# Patient Record
Sex: Male | Born: 1935 | Race: White | Hispanic: No | Marital: Married | Smoking: Never smoker
Health system: Southern US, Community
[De-identification: ages and names within clinical notes are randomized; demographics above are authoritative.]

## PROBLEM LIST (undated history)

## (undated) DIAGNOSIS — Z8546 Personal history of malignant neoplasm of prostate: Secondary | ICD-10-CM

## (undated) DIAGNOSIS — E119 Type 2 diabetes mellitus without complications: Secondary | ICD-10-CM

## (undated) DIAGNOSIS — I10 Essential (primary) hypertension: Secondary | ICD-10-CM

## (undated) DIAGNOSIS — I2510 Atherosclerotic heart disease of native coronary artery without angina pectoris: Secondary | ICD-10-CM

## (undated) DIAGNOSIS — N189 Chronic kidney disease, unspecified: Secondary | ICD-10-CM

## (undated) HISTORY — PX: APPENDECTOMY: SHX54

## (undated) HISTORY — PX: CHOLECYSTECTOMY: SHX55

## (undated) HISTORY — PX: OTHER SURGICAL HISTORY: SHX169

---

## 2014-01-27 ENCOUNTER — Encounter (HOSPITAL_COMMUNITY): Payer: Self-pay | Admitting: Internal Medicine

## 2014-01-27 ENCOUNTER — Inpatient Hospital Stay (HOSPITAL_COMMUNITY)
Admission: AD | Admit: 2014-01-27 | Discharge: 2014-02-01 | DRG: 377 | Disposition: A | Discharge status: Hospice | Payer: Medicare PPO | Source: Other Acute Inpatient Hospital | Attending: Internal Medicine | Admitting: Internal Medicine

## 2014-01-27 DIAGNOSIS — D62 Acute posthemorrhagic anemia: Secondary | ICD-10-CM | POA: Diagnosis present

## 2014-01-27 DIAGNOSIS — K208 Other esophagitis without bleeding: Secondary | ICD-10-CM | POA: Diagnosis present

## 2014-01-27 DIAGNOSIS — Z515 Encounter for palliative care: Secondary | ICD-10-CM

## 2014-01-27 DIAGNOSIS — Z923 Personal history of irradiation: Secondary | ICD-10-CM

## 2014-01-27 DIAGNOSIS — I129 Hypertensive chronic kidney disease with stage 1 through stage 4 chronic kidney disease, or unspecified chronic kidney disease: Secondary | ICD-10-CM | POA: Diagnosis present

## 2014-01-27 DIAGNOSIS — N189 Chronic kidney disease, unspecified: Secondary | ICD-10-CM

## 2014-01-27 DIAGNOSIS — R1013 Epigastric pain: Secondary | ICD-10-CM

## 2014-01-27 DIAGNOSIS — R112 Nausea with vomiting, unspecified: Secondary | ICD-10-CM

## 2014-01-27 DIAGNOSIS — K922 Gastrointestinal hemorrhage, unspecified: Secondary | ICD-10-CM

## 2014-01-27 DIAGNOSIS — E119 Type 2 diabetes mellitus without complications: Secondary | ICD-10-CM

## 2014-01-27 DIAGNOSIS — E860 Dehydration: Secondary | ICD-10-CM | POA: Diagnosis present

## 2014-01-27 DIAGNOSIS — Z79899 Other long term (current) drug therapy: Secondary | ICD-10-CM

## 2014-01-27 DIAGNOSIS — R531 Weakness: Secondary | ICD-10-CM

## 2014-01-27 DIAGNOSIS — I10 Essential (primary) hypertension: Secondary | ICD-10-CM

## 2014-01-27 DIAGNOSIS — I25810 Atherosclerosis of coronary artery bypass graft(s) without angina pectoris: Secondary | ICD-10-CM

## 2014-01-27 DIAGNOSIS — K3189 Other diseases of stomach and duodenum: Secondary | ICD-10-CM

## 2014-01-27 DIAGNOSIS — K311 Adult hypertrophic pyloric stenosis: Secondary | ICD-10-CM

## 2014-01-27 DIAGNOSIS — K319 Disease of stomach and duodenum, unspecified: Secondary | ICD-10-CM | POA: Diagnosis present

## 2014-01-27 DIAGNOSIS — Z8546 Personal history of malignant neoplasm of prostate: Secondary | ICD-10-CM

## 2014-01-27 DIAGNOSIS — R64 Cachexia: Secondary | ICD-10-CM | POA: Diagnosis present

## 2014-01-27 DIAGNOSIS — Z9221 Personal history of antineoplastic chemotherapy: Secondary | ICD-10-CM

## 2014-01-27 DIAGNOSIS — IMO0002 Reserved for concepts with insufficient information to code with codable children: Secondary | ICD-10-CM

## 2014-01-27 DIAGNOSIS — E43 Unspecified severe protein-calorie malnutrition: Secondary | ICD-10-CM

## 2014-01-27 DIAGNOSIS — N179 Acute kidney failure, unspecified: Secondary | ICD-10-CM | POA: Diagnosis present

## 2014-01-27 DIAGNOSIS — F17200 Nicotine dependence, unspecified, uncomplicated: Secondary | ICD-10-CM | POA: Diagnosis present

## 2014-01-27 DIAGNOSIS — K921 Melena: Principal | ICD-10-CM | POA: Diagnosis present

## 2014-01-27 HISTORY — DX: Type 2 diabetes mellitus without complications: E11.9

## 2014-01-27 HISTORY — DX: Personal history of malignant neoplasm of prostate: Z85.46

## 2014-01-27 HISTORY — DX: Chronic kidney disease, unspecified: N18.9

## 2014-01-27 HISTORY — DX: Atherosclerotic heart disease of native coronary artery without angina pectoris: I25.10

## 2014-01-27 HISTORY — DX: Essential (primary) hypertension: I10

## 2014-01-27 LAB — COMPREHENSIVE METABOLIC PANEL
ALK PHOS: 37 U/L — AB (ref 39–117)
ALT: 7 U/L (ref 0–53)
AST: 13 U/L (ref 0–37)
Albumin: 3.1 g/dL — ABNORMAL LOW (ref 3.5–5.2)
BUN: 30 mg/dL — ABNORMAL HIGH (ref 6–23)
CALCIUM: 9.1 mg/dL (ref 8.4–10.5)
CO2: 29 meq/L (ref 19–32)
Chloride: 102 mEq/L (ref 96–112)
Creatinine, Ser: 2.13 mg/dL — ABNORMAL HIGH (ref 0.50–1.35)
GFR calc Af Amer: 31 mL/min — ABNORMAL LOW (ref >90)
GFR, EST NON AFRICAN AMERICAN: 26 mL/min — AB (ref >90)
Glucose, Bld: 140 mg/dL — ABNORMAL HIGH (ref 70–99)
POTASSIUM: 3.9 meq/L (ref 3.7–5.3)
SODIUM: 144 meq/L (ref 137–147)
Total Bilirubin: <0.2 mg/dL — ABNORMAL LOW (ref 0.3–1.2)
Total Protein: 6 g/dL (ref 6.0–8.3)

## 2014-01-27 LAB — CBC WITH DIFFERENTIAL/PLATELET
BASOS ABS: 0 10*3/uL (ref 0.0–0.1)
Basophils Relative: 0 % (ref 0–1)
Eosinophils Absolute: 0 10*3/uL (ref 0.0–0.7)
Eosinophils Relative: 0 % (ref 0–5)
HCT: 23.2 % — ABNORMAL LOW (ref 39.0–52.0)
Hemoglobin: 7.7 g/dL — ABNORMAL LOW (ref 13.0–17.0)
LYMPHS ABS: 0.6 10*3/uL — AB (ref 0.7–4.0)
Lymphocytes Relative: 7 % — ABNORMAL LOW (ref 12–46)
MCH: 30 pg (ref 26.0–34.0)
MCHC: 33.2 g/dL (ref 30.0–36.0)
MCV: 90.3 fL (ref 78.0–100.0)
Monocytes Absolute: 0.9 10*3/uL (ref 0.1–1.0)
Monocytes Relative: 11 % (ref 3–12)
NEUTROS PCT: 81 % — AB (ref 43–77)
Neutro Abs: 6.6 10*3/uL (ref 1.7–7.7)
Platelets: 172 10*3/uL (ref 150–400)
RBC: 2.57 MIL/uL — AB (ref 4.22–5.81)
RDW: 14.5 % (ref 11.5–15.5)
WBC: 8.2 10*3/uL (ref 4.0–10.5)

## 2014-01-27 LAB — PREPARE RBC (CROSSMATCH)

## 2014-01-27 LAB — HEMOGLOBIN AND HEMATOCRIT, BLOOD
HCT: 21.8 % — ABNORMAL LOW (ref 39.0–52.0)
HCT: 25.8 % — ABNORMAL LOW (ref 39.0–52.0)
Hemoglobin: 7.2 g/dL — ABNORMAL LOW (ref 13.0–17.0)
Hemoglobin: 8.7 g/dL — ABNORMAL LOW (ref 13.0–17.0)

## 2014-01-27 LAB — ABO/RH: ABO/RH(D): O POS

## 2014-01-27 MED ORDER — PROMETHAZINE HCL 25 MG/ML IJ SOLN: 12.5000 mg | Freq: Four times a day (QID) | INTRAMUSCULAR | Status: DC | PRN

## 2014-01-27 MED ORDER — ALUM & MAG HYDROXIDE-SIMETH 200-200-20 MG/5ML PO SUSP
30.0000 mL | Freq: Four times a day (QID) | ORAL | Status: DC | PRN
Start: 2014-01-27 — End: 2014-02-01

## 2014-01-27 MED ORDER — ONDANSETRON HCL 4 MG/2ML IJ SOLN
4.0000 mg | Freq: Four times a day (QID) | INTRAMUSCULAR | Status: DC | PRN
  Administered 2014-01-27: 4 mg via INTRAVENOUS
  Filled 2014-01-27: qty 2

## 2014-01-27 MED ORDER — ACETAMINOPHEN 325 MG PO TABS: 650.0000 mg | ORAL_TABLET | Freq: Four times a day (QID) | ORAL | Status: DC | PRN

## 2014-01-27 MED ORDER — SODIUM CHLORIDE 0.9 % IV SOLN: INTRAVENOUS | Status: DC

## 2014-01-27 MED ORDER — SODIUM CHLORIDE 0.9 % IV SOLN
INTRAVENOUS | Status: DC
  Administered 2014-01-27: 05:00:00 via INTRAVENOUS
  Administered 2014-01-28 (×2): 75 mL/h via INTRAVENOUS

## 2014-01-27 MED ORDER — PANTOPRAZOLE SODIUM 40 MG IV SOLR
40.0000 mg | Freq: Two times a day (BID) | INTRAVENOUS | Status: DC
  Administered 2014-01-27 – 2014-01-31 (×10): 40 mg via INTRAVENOUS
  Filled 2014-01-27 (×12): qty 40

## 2014-01-27 MED ORDER — HYDROMORPHONE HCL PF 1 MG/ML IJ SOLN: 0.5000 mg | INTRAMUSCULAR | Status: DC | PRN

## 2014-01-27 MED ORDER — ACETAMINOPHEN 650 MG RE SUPP: 650.0000 mg | Freq: Four times a day (QID) | RECTAL | Status: DC | PRN

## 2014-01-27 MED ORDER — ONDANSETRON HCL 4 MG PO TABS: 4.0000 mg | ORAL_TABLET | Freq: Four times a day (QID) | ORAL | Status: DC | PRN

## 2014-01-27 MED ORDER — OXYCODONE HCL 5 MG PO TABS: 5.0000 mg | ORAL_TABLET | ORAL | Status: DC | PRN

## 2014-01-27 NOTE — Consult Note (Signed)
Subjective:   HPI  The patient is a 80 year old male who was admitted to the hospital because of epigastric abdominal pain, melena, and an abnormal CT scan. The patient states that he has been having pain in the upper abdomen for about a month. His primary care physician put him on ranitidine about a month ago. The pain in the epigastrium has continued. For the last few days he has been experiencing melena. He has recently had some vomiting but denies hematemesis. He went to the hospital in [place] where a CT scan was done and is reported to show evidence of a gastric outlet obstruction. He was transferred here for further evaluation.According to the family his primary care physician was also concern about his color and thought that he was yellow, but his bilirubin here is not elevated. The patient had been on Plavix, but hasn't taken it in a couple of days.  Review of Systems Denies chest pain or shortness of breath.  Past Medical History  Diagnosis Date  . CAD (coronary artery disease)   . Hypertension   . H/O prostate cancer     S/P Chemo and Radiation Rx  . Chronic kidney disease   . Diabetes     taken off meds   Past Surgical History  Procedure Laterality Date  . Excision of skin cancer on scalp     History   Social History  . Marital Status: Married    Spouse Name: N/A    Number of Children: N/A  . Years of Education: N/A   Occupational History  . Not on file.   Social History Main Topics  . Smoking status: Not on file  . Smokeless tobacco: Not on file  . Alcohol Use: Not on file  . Drug Use: Not on file  . Sexual Activity: Not on file   Other Topics Concern  . Not on file   Social History Narrative  . No narrative on file   family history includes Cancer in his father and mother; Diabetes in his brother. Current facility-administered medications:0.9 %  sodium chloride infusion, , Intravenous, Continuous, Theressa Millard, MD, Last Rate: 75 mL/hr at  01/27/14 0443;  acetaminophen (TYLENOL) suppository 650 mg, 650 mg, Rectal, Q6H PRN, Theressa Millard, MD;  acetaminophen (TYLENOL) tablet 650 mg, 650 mg, Oral, Q6H PRN, Theressa Millard, MD alum & mag hydroxide-simeth (MAALOX/MYLANTA) 200-200-20 MG/5ML suspension 30 mL, 30 mL, Oral, Q6H PRN, Theressa Millard, MD;  HYDROmorphone (DILAUDID) injection 0.5-1 mg, 0.5-1 mg, Intravenous, Q3H PRN, Theressa Millard, MD;  ondansetron (ZOFRAN) injection 4 mg, 4 mg, Intravenous, Q6H PRN, Theressa Millard, MD, 4 mg at 01/27/14 0443;  ondansetron (ZOFRAN) tablet 4 mg, 4 mg, Oral, Q6H PRN, Theressa Millard, MD oxyCODONE (Oxy IR/ROXICODONE) immediate release tablet 5 mg, 5 mg, Oral, Q4H PRN, Theressa Millard, MD;  pantoprazole (PROTONIX) injection 40 mg, 40 mg, Intravenous, Q12H, Theressa Millard, MD, 40 mg at 01/27/14 0539;  promethazine (PHENERGAN) injection 12.5 mg, 12.5 mg, Intravenous, Q6H PRN, Robbie Lis, MD Allergies  Allergen Reactions  . Salicylates Anaphylaxis    Allergy to Aspirin      Objective:     BP 140/68  Pulse 74  Temp(Src) 98.1 F (36.7 C) (Oral)  Resp 16  Ht 5\' 9"  (1.753 m)  Wt 60.328 kg (133 lb)  BMI 19.63 kg/m2  SpO2 96%  He does not appear in any acute distress, he is somewhat pale.  Heart regular rhythm no  murmurs  Lungs clear  Abdomen: Bowel sounds normal, soft, mild tenderness in the epigastrium without rebound or guarding, no obvious hepatosplenomegaly    Laboratory No components found with this basename: d1      Assessment:     #1. Melena  #2. Anemia  #3. Report of an abnormal CT scan showing gastric outlet obstruction from an outside hospital.      Plan:     Continue IV fluids. Keep n.p.o. We will proceed with EGD in the morning.     Component Value Date/Time   WBC 8.2 01/27/2014 0508   HGB 7.7* 01/27/2014 0508   HCT 23.2* 01/27/2014 0508   PLT 172 01/27/2014 0508   ALT 7 01/27/2014 0508   AST 13 01/27/2014 0508   NA 144 01/27/2014  0508   K 3.9 01/27/2014 0508   CL 102 01/27/2014 0508   CREATININE 2.13* 01/27/2014 0508   BUN 30* 01/27/2014 0508   CO2 29 01/27/2014 0508   CALCIUM 9.1 01/27/2014 0508   ALKPHOS 37* 01/27/2014 0508

## 2014-01-27 NOTE — Progress Notes (Signed)
Blood transfusion completed.  No active bleeding noted at this time.

## 2014-01-27 NOTE — Progress Notes (Signed)
[facility] PROGRESS NOTE  [name] [name] EQA:[id number] DOB: 21-Jun-1925 DOA: 01/27/2014 PCP: No PCP Per Patient  Brief narrative: Addendum to admission note done today 01/27/2014 80 y.o. male with past medical history of prostate cancer in remission who presented from [facility] ED to [facility] ED 01/27/2014 with ongoing nausea, vomiting and epigastric pain for past 5 days prior to this admission. He also had black tarry stools. CT abd revealed gastric outlet obstruction. CT was done in [place] not in [facility]. Blood work in [facility] revealed hemoglobin of 7.7 and creatinine of 2.13 (no other blood work available for comparison). GI consulted for input on management and further evaluation.  Assessment/Plan:  Principal Problem:   Probable upper GI bleed - possible gastric ulcer, possible malignancy  - Heme (+) stool  - hemoglobin 7.7 on this admission; will give 1 unit PRBC transfusion - continue protonix 40 mg IV Q 12 hours - may continue IV fluids as pt is NPO - appreciate GI consult and recomendations Active Problems:   Gastric outlet obstruction - keep NPO - continue IV fluids - zofran and phenergan for nausea/ vomiting and refractory N/V   Nausea and vomiting - secondary to gastric outlet obstruction - antiemetics PRN   Acute renal failure - possible prerenal etiology secondary to GI losses, dehydration - continue IV fluids - follow up BMP in am  Code Status: full code Family Communication: family at the bedside  Disposition Plan: remains inpatient   Leisa Lenz, MD  [facility] Pager [phone]  If 7PM-7AM, please contact night-coverage [email or web address] Password [facility] 01/27/2014, 9:47 AM   LOS: 0 days   Consultants:  Gastroenterology (Dr. Paulita Fujita)  Procedures:  None   Antibiotics:  None   HPI/Subjective: Still with N/V.  Objective: Filed Vitals:   01/27/14 0300  BP: 140/68  Pulse: 74  Temp: 98.1 F (36.7 C)  TempSrc: Oral  Resp: 16  Height:  5\' 9"  (1.753 m)  Weight: 60.328 kg (133 lb)  SpO2: 96%   No intake or output data in the 24 hours ending 01/27/14 0947  Exam:   General:  Pt is alert, follows commands appropriately, not in acute distress  Cardiovascular: Regular rate and rhythm, S1/S2 appreciated  Respiratory: Clear to auscultation bilaterally, no wheezing, no crackles, no rhonchi  Abdomen: firm to palpation, tender somewhat across mid abdomen, bowel sounds present, no guarding  Extremities: No edema, pulses DP and PT palpable bilaterally  Neuro: Grossly nonfocal  Data Reviewed: Basic Metabolic Panel:  Recent Labs Lab 01/27/14 0508  NA 144  K 3.9  CL 102  CO2 29  GLUCOSE 140*  BUN 30*  CREATININE 2.13*  CALCIUM 9.1   Liver Function Tests:  Recent Labs Lab 01/27/14 0508  AST 13  ALT 7  ALKPHOS 37*  BILITOT <0.2*  PROT 6.0  ALBUMIN 3.1*   No results found for this basename: LIPASE, AMYLASE,  in the last 168 hours No results found for this basename: AMMONIA,  in the last 168 hours CBC:  Recent Labs Lab 01/27/14 0508  WBC 8.2  NEUTROABS 6.6  HGB 7.7*  HCT 23.2*  MCV 90.3  PLT 172   Cardiac Enzymes: No results found for this basename: CKTOTAL, CKMB, CKMBINDEX, TROPONINI,  in the last 168 hours BNP: No components found with this basename: POCBNP,  CBG: No results found for this basename: GLUCAP,  in the last 168 hours  No results found for this or any previous visit (from the past 240 hour(s)).   Studies:  No results found.  Scheduled Meds: . pantoprazole (PROTONIX) IV  40 mg Intravenous Q12H   Continuous Infusions: . sodium chloride 75 mL/hr at 01/27/14 0443

## 2014-01-27 NOTE — H&P (Signed)
[facility] History and Physical  [name] [name] KNL:[id number] DOB: December 29, 1924 DOA: 01/27/2014  Referring physician:  EDP PCP: No PCP Per Patient  Specialists:   Chief Complaint:  Nausea,  Vomiting, ABD Pain and Black Stools  HPI: [name] [name] is a 80 y.o. male who was taken to the [facility] ED due to complaints of nausea and vomiting and Epigastric ABD pain and black tarry stools x 5 days.   He denies having any hematemesis or fevers or chills or chest pain or SOB.   He was seen during the week by his PCP for his symptoms and was found to have jaundice, so the PCP sent blood work and stool studies per the patient.   They were contacted and told to go to the ED  Yesterday.  In the ED in [place] he was evaluated and was found to have a hemoglobin of 7.5 and his last hemoglobin on record  6 months ago had been 9.3.   A rectal exam and FOBT was done and was HEME +.  He was sent for a Ct scan of his ABD without Contrast due to his CKD and the results revealed a Gastric Outlet Obstruction.   Arrangements were made to transfer him to [facility] for a GI consultation and for further evaluation and treatment.       Review of Systems:  Constitutional: No Weight Loss, No Weight Gain, Night Sweats, Fevers, Chills, Fatigue, or +Generalized Weakness HEENT: No Headaches, Difficulty Swallowing,Tooth/Dental Problems,Sore Throat,  No Sneezing, Rhinitis, Ear Ache, Nasal Congestion, or Post Nasal Drip,  Cardio-vascular:  No Chest pain, Orthopnea, PND, Edema in lower extremities, Anasarca, Dizziness, Palpitations  Resp: No Dyspnea, No DOE, No Productive Cough, No Non-Productive Cough, No Hemoptysis, No Change in Color of Mucus,  No Wheezing.    GI: No Heartburn, Indigestion, +Abdominal Pain, +Nausea, +Vomiting,+ Melena,  No Hematemesis, No Hematochezia, No Diarrhea, Change in Bowel Habits,  Loss of Appetite  GU: No Dysuria, Change in Color of Urine, No Urgency or Frequency.  No flank pain.   Musculoskeletal: No Joint Pain or Swelling.  No Decreased Range of Motion. No Back Pain.  Neurologic: No Syncope, No Seizures, Muscle Weakness, Paresthesia, Vision Disturbance or Loss, No Diplopia, No Vertigo, No Difficulty Walking,  Skin: No Rash or Lesions. Psych: No Change in Mood or Affect. No Depression or Anxiety. No Memory loss. No Confusion or Hallucinations   Past Medical History:     CAD  HTN  DM2  Hx  Prostate Cancer S/P Chemo and Radiation Rx  Nephrolithiasis  Past Surgical History:    Skin Cancer Excision from Scalp   Medications:     Tizanidine 2mg  PO TID for Muscle cramps and Spasms  Lisinopril 40 mg PO q day   Plavix 75 mg PO q day  Ranitidine 300 mg PO qhs.        Allergies  Allergen Reactions  . Salicylates Anaphylaxis    Allergy to Aspirin      Social History:   Married   has no tobacco, alcohol, and drug history on file.      Family History:    Mother with Cancer (Unknown type)  Father with Cancer (Unkonwn Type)  Diabetes in 2 Brothers    Physical Exam:  GEN:  Pleasant Elderly Well Nourished and Well Developed  80 y.o. Caucasian male  examined  and in no acute distress; cooperative with exam Filed Vitals:   01/27/14 0300  BP: 140/68  Pulse: 74  Temp: 98.1 F (36.7 C)  TempSrc: Oral  Resp: 16  Height: 5\' 9"  (1.753 m)  Weight: 60.328 kg (133 lb)  SpO2: 96%   Blood pressure 140/68, pulse 74, temperature 98.1 F (36.7 C), temperature source Oral, resp. rate 16, height 5\' 9"  (1.753 m), weight 60.328 kg (133 lb), SpO2 96.00%. PSYCH: He is alert and oriented x4; does not appear anxious does not appear depressed; affect is normal HEENT: Normocephalic and Atraumatic, Mucous membranes pink; PERRLA; EOM intact; Fundi:  Benign;  +Scleral Icterus,  Nares: Patent, Oropharynx: Clear, Edentulous, Neck:  FROM, no cervical lymphadenopathy nor thyromegaly or carotid bruit; no JVD; Breasts:: Not examined CHEST WALL: No tenderness CHEST: Normal  respiration, clear to auscultation bilaterally HEART: Regular rate and rhythm; no murmurs rubs or gallops BACK: No kyphosis or scoliosis; no CVA tenderness ABDOMEN: Positive Bowel Sounds, soft non-tender; no masses, no organomegaly, No Rebound , No Guarding.   Rectal Exam: Done by EDP,   FOBT:   Black soft Stool  HEME+++ EXTREMITIES: No cyanosis, clubbing or edema; no ulcerations. Genitalia: not examined PULSES: 2+ and symmetric SKIN: Normal hydration no rash or ulceration CNS:  Alert and Oriented x 4,  Cranial Nerves Intact except Hard Of Hearing, Sensory and Motor Fxn Intact, Gait not Assessed, Gait: deferred  Vascular: pulses palpable throughout    Labs on Admission:  Basic Metabolic Panel:  Recent Labs Lab 01/27/14 0508  NA 144  K 3.9  CL 102  CO2 29  GLUCOSE 140*  BUN 30*  CREATININE 2.13*  CALCIUM 9.1   Liver Function Tests:  Recent Labs Lab 01/27/14 0508  AST 13  ALT 7  ALKPHOS 37*  BILITOT <0.2*  PROT 6.0  ALBUMIN 3.1*   No results found for this basename: LIPASE, AMYLASE,  in the last 168 hours No results found for this basename: AMMONIA,  in the last 168 hours CBC:  Recent Labs Lab 01/27/14 0508  WBC 8.2  NEUTROABS 6.6  HGB 7.7*  HCT 23.2*  MCV 90.3  PLT 172   Cardiac Enzymes: No results found for this basename: CKTOTAL, CKMB, CKMBINDEX, TROPONINI,  in the last 168 hours  BNP (last 3 results) No results found for this basename: PROBNP,  in the last 8760 hours CBG: No results found for this basename: GLUCAP,  in the last 168 hours  Radiological Exams on Admission: No results found.    Assessment/Plan:   80 y.o. male with  Principal Problem:   GI bleed Active Problems:   Gastric outlet obstruction   Nausea and vomiting   Abdominal pain, epigastric   CAD (coronary artery disease) of artery bypass graft   Diabetes mellitus   Essential hypertension, benign   Chronic kidney disease   H/O prostate cancer     1.  GI Bleed-  Upper Gi  source most likely since he has Melena,  IV Protonix Q 12 hrs, and IVFs ordered for maintenance fluid.  Monitor H/Hs for decrease, and a type and Screen sent .   GI consult in AM.  2.  Gastric Outlet Obstructions- Se[facility] on CT scan done [facility],  ?  Carcinoma  Versus complex Ulcer.   Gi COnsult for Options for evaluation ,  NPO for now, and IV Anti-Emetics PRN, May Need NG Tube placed to LIMS.    3.  Nausea and Vomiting due to #2.    4.  Epigastric ABD Pain- due to #1, and #2.    5.  CAD- Hx in his  remote past,  Has been on Plavix , Plavix discontinued now due to #1.    6.  DM2-  On no medications currently, reports he no longer had to take medication for Diabetes,  Check HbA1c and monitor Glucose levels.    7.  HTN-   Continue Lisinopril Rx. Monitor BPs.    8.  CKD-  Check BUN/Cr, Reports hx of CKD unknown stage, reports  Being seen by a Nephrologist.  Appears Stage IV per labs this AM.    9.  Hx of Prostate Cancer- in remission by reports, hx of Chemo and Radiation Rx.    10 .  SCDs for DVT prophylaxis.        Code Status:   FULL CODE Family Communication:    Wife and Son At Bedside Disposition Plan:       Inpatient  Time spent:  [place] [facility] Pager [phone]  If 7PM-7AM, please contact night-coverage [email or web address] Password [facility] 01/27/2014, 7:33 AM

## 2014-01-28 ENCOUNTER — Encounter (HOSPITAL_COMMUNITY): Payer: Self-pay

## 2014-01-28 ENCOUNTER — Encounter (HOSPITAL_COMMUNITY)
Admission: AD | Disposition: A | Discharge status: Hospice | Payer: Self-pay | Source: Other Acute Inpatient Hospital | Attending: Internal Medicine

## 2014-01-28 DIAGNOSIS — I10 Essential (primary) hypertension: Secondary | ICD-10-CM

## 2014-01-28 HISTORY — PX: ESOPHAGOGASTRODUODENOSCOPY: SHX5428

## 2014-01-28 LAB — URINALYSIS, ROUTINE W REFLEX MICROSCOPIC
Bilirubin Urine: NEGATIVE
GLUCOSE, UA: NEGATIVE mg/dL
Ketones, ur: NEGATIVE mg/dL
Leukocytes, UA: NEGATIVE
Nitrite: NEGATIVE
Protein, ur: NEGATIVE mg/dL
SPECIFIC GRAVITY, URINE: 1.014 (ref 1.005–1.030)
Urobilinogen, UA: 0.2 mg/dL (ref 0.0–1.0)
pH: 5 (ref 5.0–8.0)

## 2014-01-28 LAB — GLUCOSE, CAPILLARY
Glucose-Capillary: 89 mg/dL (ref 70–99)
Glucose-Capillary: 74 mg/dL (ref 70–99)

## 2014-01-28 LAB — HEMOGLOBIN AND HEMATOCRIT, BLOOD
HCT: 23.7 % — ABNORMAL LOW (ref 39.0–52.0)
HCT: 24 % — ABNORMAL LOW (ref 39.0–52.0)
HEMATOCRIT: 28.3 % — AB (ref 39.0–52.0)
HEMOGLOBIN: 8 g/dL — AB (ref 13.0–17.0)
Hemoglobin: 7.9 g/dL — ABNORMAL LOW (ref 13.0–17.0)
Hemoglobin: 9.3 g/dL — ABNORMAL LOW (ref 13.0–17.0)

## 2014-01-28 LAB — BASIC METABOLIC PANEL
BUN: 26 mg/dL — ABNORMAL HIGH (ref 6–23)
CALCIUM: 8.8 mg/dL (ref 8.4–10.5)
CO2: 22 mEq/L (ref 19–32)
Chloride: 106 mEq/L (ref 96–112)
Creatinine, Ser: 2.02 mg/dL — ABNORMAL HIGH (ref 0.50–1.35)
GFR calc Af Amer: 33 mL/min — ABNORMAL LOW (ref >90)
GFR, EST NON AFRICAN AMERICAN: 28 mL/min — AB (ref >90)
GLUCOSE: 77 mg/dL (ref 70–99)
Potassium: 4.4 mEq/L (ref 3.7–5.3)
SODIUM: 141 meq/L (ref 137–147)

## 2014-01-28 LAB — URINE MICROSCOPIC-ADD ON

## 2014-01-28 SURGERY — EGD (ESOPHAGOGASTRODUODENOSCOPY)
Anesthesia: Moderate Sedation

## 2014-01-28 MED ORDER — PRO-STAT SUGAR FREE PO LIQD
30.0000 mL | Freq: Two times a day (BID) | ORAL | Status: DC
  Administered 2014-01-28 – 2014-01-31 (×6): 30 mL via ORAL
  Filled 2014-01-28 (×10): qty 30

## 2014-01-28 MED ORDER — TIZANIDINE HCL 2 MG PO TABS
2.0000 mg | ORAL_TABLET | Freq: Three times a day (TID) | ORAL | Status: DC
  Administered 2014-01-28 – 2014-02-01 (×12): 2 mg via ORAL
  Filled 2014-01-28 (×16): qty 1

## 2014-01-28 MED ORDER — MIDAZOLAM HCL 10 MG/2ML IJ SOLN
INTRAMUSCULAR | Status: DC | PRN
  Administered 2014-01-28 (×2): 1 mg via INTRAVENOUS

## 2014-01-28 MED ORDER — BUTAMBEN-TETRACAINE-BENZOCAINE 2-2-14 % EX AERO
INHALATION_SPRAY | CUTANEOUS | Status: DC | PRN
  Administered 2014-01-28: 2 via TOPICAL

## 2014-01-28 MED ORDER — BOOST / RESOURCE BREEZE PO LIQD
1.0000 | Freq: Two times a day (BID) | ORAL | Status: DC
  Administered 2014-01-29 – 2014-01-31 (×5): 1 via ORAL

## 2014-01-28 MED ORDER — FENTANYL CITRATE 0.05 MG/ML IJ SOLN
INTRAMUSCULAR | Status: AC
  Filled 2014-01-28: qty 2

## 2014-01-28 MED ORDER — MIDAZOLAM HCL 5 MG/ML IJ SOLN
INTRAMUSCULAR | Status: AC
  Filled 2014-01-28: qty 2

## 2014-01-28 MED ORDER — DIPHENHYDRAMINE HCL 50 MG/ML IJ SOLN
INTRAMUSCULAR | Status: AC
  Filled 2014-01-28: qty 1

## 2014-01-28 MED ORDER — FENTANYL CITRATE 0.05 MG/ML IJ SOLN
INTRAMUSCULAR | Status: DC | PRN
  Administered 2014-01-28 (×2): 12.5 ug via INTRAVENOUS

## 2014-01-28 MED ORDER — LISINOPRIL 40 MG PO TABS
40.0000 mg | ORAL_TABLET | Freq: Every day | ORAL | Status: DC
  Administered 2014-01-28: 40 mg via ORAL
  Filled 2014-01-28 (×2): qty 1

## 2014-01-28 NOTE — Op Note (Signed)
[facility] [place], [address]   ENDOSCOPY PROCEDURE REPORT  PATIENT: [name] [name], [name] [name]  MR#: [id number] BIRTHDATE: [birth year] , 6  yrs. old GENDER: Male ENDOSCOPIST: Acquanetta Sit, MD REFERRED BY: PROCEDURE DATE:  01/28/2014 PROCEDURE:   EGD with biopsy ASA CLASS: 2 INDICATIONS: melena, abnormal CT scan raising the possibility of a gastric outlet obstruction MEDICATIONS: fentanyl 25 mcg IV, Versed 2 mg IV TOPICAL ANESTHETIC: Cetacaine spray to the oropharynx  DESCRIPTION OF PROCEDURE:   After the risks benefits and alternatives of the procedure were thoroughly explained, informed consent was obtained.  The Pentax Gastroscope [id number]  endoscope was introduced through the mouth and advanced to the second portion of the duodenum      , limited by Without limitations.   The instrument was slowly withdrawn as the mucosa was fully examined.      FINDINGS:  Esophagus: Distal erosive esophagitis which was friable.  Stomach: Distal antral mass as seen on image 003 and image 007 above. The mass was in the distal antrum right at the pyloric channel area. It was firm to palpation, and friable. Biopsies were obtained of this mass. It partially obstructed the pyloric channel but the scope was able to be maneuvered through the pyloric channel and into the duodenum.  Duodenum: Normal  COMPLICATIONS:none  ENDOSCOPIC IMPRESSION:see above   RECOMMENDATIONS:check pathology on this mass. Probable surgical consultation will be required.      _______________________________ eSigned:  Acquanetta Sit, MD 01/28/2014 9:58 AM

## 2014-01-28 NOTE — Progress Notes (Signed)
UR completed [place] [facility] K. Jaeda Bruso, RN, BSN, MSHL, CCM  01/28/2014 8:22 AM

## 2014-01-28 NOTE — Progress Notes (Addendum)
[facility] PROGRESS NOTE  [name] [name] OZD:[id number] DOB: [birth year] DOA: 01/27/2014 PCP: No PCP Per Patient  Assessment/Plan: 80 y.o. male with past medical history of prostate cancer in remission who presented from [facility] ED to [facility] ED 01/27/2014 with ongoing nausea, vomiting and epigastric pain for past 5 days prior to this admission. He also had black tarry stools. CT abd revealed gastric outlet obstruction. CT was done in [place] not in [facility]. Blood work in [facility] revealed hemoglobin of 7.7 and creatinine of 2.13 (no other blood work available for comparison). GI consulted for input on management and further evaluation.    Principal Problem:  1. Probable upper GI bleed  - possible gastric ulcer, possible malignancy with Heme (+) stool  - continue protonix 40 mg IV Q 12 hours, pend EGD;  appreciate GI consult and recomendations   2. Gastric outlet obstruction  - keep NPO, continue IV fluids, zofran and phenergan for nausea/ vomiting and refractory N/V  -pen EGD  3. Acute blood loss anemia due to GIB;  -TFsed 1 unit 2/15; monitor Hg, TF prn  4. Nausea and vomiting  - secondary to gastric outlet obstruction,. antiemetics PRN   5. Acute renal failure ? Prerenal, + ACE  - continue IV fluids, follow up BMP in am; hold ACE, obtain UA  6. HTN hold ACE, cont monitor off meds, stable   Code Status: full Family Communication: d/w patient, wife, step son (indicate person spoken with, relationship, and if by phone, the number) Disposition Plan: home when ready, pend EGD   Consultants:  GI  Procedures:  Pend EGD   Antibiotics:  None  (indicate start date, and stop date if known)  HPI/Subjective: alert  Objective: Filed Vitals:   01/28/14 0459  BP: 114/48  Pulse: 85  Temp: 98.9 F (37.2 C)  Resp: 17    Intake/Output Summary (Last 24 hours) at 01/28/14 0805 Last data filed at 01/27/14 1503  Gross per 24 hour  Intake 641.87 ml  Output    200 ml   Net 441.87 ml   Filed Weights   01/27/14 0300 01/28/14 0459  Weight: 60.328 kg (133 lb) 61.3 kg (135 lb 2.3 oz)    Exam:   General:  alert  Cardiovascular: s1,s2 rrr  Respiratory: few crackles in LL  Abdomen: soft, mild tender epigastric, ND  Musculoskeletal: no LE edema    Data Reviewed: Basic Metabolic Panel:  Recent Labs Lab 01/27/14 0508  NA 144  K 3.9  CL 102  CO2 29  GLUCOSE 140*  BUN 30*  CREATININE 2.13*  CALCIUM 9.1   Liver Function Tests:  Recent Labs Lab 01/27/14 0508  AST 13  ALT 7  ALKPHOS 37*  BILITOT <0.2*  PROT 6.0  ALBUMIN 3.1*   No results found for this basename: LIPASE, AMYLASE,  in the last 168 hours No results found for this basename: AMMONIA,  in the last 168 hours CBC:  Recent Labs Lab 01/27/14 0508 01/27/14 1145 01/27/14 2000 01/28/14 0447  WBC 8.2  --   --   --   NEUTROABS 6.6  --   --   --   HGB 7.7* 7.2* 8.7* 7.9*  HCT 23.2* 21.8* 25.8* 23.7*  MCV 90.3  --   --   --   PLT 172  --   --   --    Cardiac Enzymes: No results found for this basename: CKTOTAL, CKMB, CKMBINDEX, TROPONINI,  in the last 168 hours BNP (last 3 results)  No results found for this basename: PROBNP,  in the last 8760 hours CBG: No results found for this basename: GLUCAP,  in the last 168 hours  No results found for this or any previous visit (from the past 240 hour(s)).   Studies: No results found.  Scheduled Meds: . pantoprazole (PROTONIX) IV  40 mg Intravenous Q12H   Continuous Infusions: . sodium chloride 75 mL/hr (01/28/14 0747)  . sodium chloride      Principal Problem:   GI bleed Active Problems:   Gastric outlet obstruction   Nausea and vomiting   Abdominal pain, epigastric   CAD (coronary artery disease) of artery bypass graft   Diabetes mellitus   Essential hypertension, benign   Chronic kidney disease   H/O prostate cancer    Time spent: >35 minutes     [name] [name]  [facility] Pager [phone].  If 7PM-7AM, please contact night-coverage at [email or web address], password [facility] 01/28/2014, 8:05 AM  LOS: 1 day

## 2014-01-28 NOTE — Progress Notes (Signed)
INITIAL NUTRITION ASSESSMENT  DOCUMENTATION CODES Per approved criteria  -Severe malnutrition in the context of chronic illness  Pt meets criteria for SEVERE MALNUTRITION in the context of Chronic Illness as evidenced by >22% weight loss in less than 6 months and severe muscle wasting in physical exam.  INTERVENTION: Provide Resource Breeze BID until diet advanced Provide Pro-stat BID until diet advanced Diet advancement per MD discretion If pt is unable to tolerate PO's with diet advancement, pt may need a J-tube for nutrition support RD to continue to monitor nutrition care plan  NUTRITION DIAGNOSIS: Unintentional weight loss related to medical condition as evidenced by >22% weight loss in less than 6 months.   Goal: Pt to meet >/= 90% of their estimated nutrition needs   Monitor:  Diet advancement/PO intake, weight, labs  Reason for Assessment: Malnutrition Screening Tool, score of 4  80 y.o. male  Admitting Dx: GI bleed  ASSESSMENT: 80 y.o. male who was taken to the [facility] ED due to complaints of nausea and vomiting and Epigastric ABD pain and black tarry stools x 5 days. He denies having any hematemesis or fevers or chills or chest pain or SOB. He was seen during the week by his PCP for his symptoms and was found to have jaundice, so the PCP sent blood work and stool studies per the patient. They were contacted and told to go to the ED Yesterday. In the ED in [place] he was evaluated and was found to have a hemoglobin of 7.5 and his last hemoglobin on record 6 months ago had been 9.3. A rectal exam and FOBT was done and was HEME +. He was sent for a Ct scan of his ABD without Contrast due to his CKD and the results revealed a Gastric Outlet Obstruction. Arrangements were made to transfer him to [facility] for a GI consultation and for further evaluation and treatment.   Pt states that his usual body weight is 174 lbs. Pt's son states that pt weight 189 lbs 5  months ago at an MD appointment. Pt reports having a good appetite and eating well PTA; pt is unsure why he is losing weight. Per pt's son, pt has been losing weight due to worrying and stress. Pt was NPO this morning, diet advanced to clear liquids for lunch and pt consumed 80% of tray items. Pt denies any chewing or swallowing difficulty. Per RN, pt needs his meds crushed.  Nutrition Focused Physical Exam:  Subcutaneous Fat:  Orbital Region: moderate wasting Upper Arm Region: moderate wasting Thoracic and Lumbar Region: NA  Muscle:  Temple Region: mild wasting Clavicle Bone Region: moderate wasting Clavicle and Acromion Bone Region: mild wasting Scapular Bone Region: NA Dorsal Hand: moderate wasting Patellar Region: severe wasting Anterior Thigh Region: moderate wasting Posterior Calf Region: mild wasting  Edema: none   Height: Ht Readings from Last 1 Encounters:  01/27/14 5\' 9"  (1.753 m)    Weight: Wt Readings from Last 1 Encounters:  01/28/14 135 lb 2.3 oz (61.3 kg)    Ideal Body Weight: 160 lbs  % Ideal Body Weight: 84%  Wt Readings from Last 10 Encounters:  01/28/14 135 lb 2.3 oz (61.3 kg)  01/28/14 135 lb 2.3 oz (61.3 kg)    Usual Body Weight: 174 lbs  % Usual Body Weight: 77.5%  BMI:  Body mass index is 19.95 kg/(m^2).  Estimated Nutritional Needs: Kcal: 1900-2100 Protein: 70-80 grams Fluid: 1.9-2.1 L/day  Skin: intact  Diet Order: Clear Liquid  EDUCATION NEEDS: -No education needs identified at this time   Intake/Output Summary (Last 24 hours) at 01/28/14 1525 Last data filed at 01/28/14 1250  Gross per 24 hour  Intake    240 ml  Output      0 ml  Net    240 ml    Last BM: 2/13   Labs:   Recent Labs Lab 01/27/14 0508 01/28/14 1240  NA 144 141  K 3.9 4.4  CL 102 106  CO2 29 22  BUN 30* 26*  CREATININE 2.13* 2.02*  CALCIUM 9.1 8.8  GLUCOSE 140* 77    CBG (last 3)   Recent Labs  01/28/14 0846 01/28/14 1110  GLUCAP 89  74    Scheduled Meds: . lisinopril  40 mg Oral Daily  . pantoprazole (PROTONIX) IV  40 mg Intravenous Q12H  . tiZANidine  2 mg Oral Q8H    Continuous Infusions: . sodium chloride 75 mL/hr (01/28/14 0747)    Past Medical History  Diagnosis Date  . CAD (coronary artery disease)   . Hypertension   . H/O prostate cancer     S/P Chemo and Radiation Rx  . Chronic kidney disease   . Diabetes     taken off meds    Past Surgical History  Procedure Laterality Date  . Excision of skin cancer on scalp      Pryor Ochoa RD, LDN Inpatient Clinical Dietitian Pager: [phone] After Hours Pager: [phone]

## 2014-01-29 ENCOUNTER — Inpatient Hospital Stay (HOSPITAL_COMMUNITY): Payer: Medicare PPO

## 2014-01-29 ENCOUNTER — Encounter (HOSPITAL_COMMUNITY): Payer: Self-pay | Admitting: Gastroenterology

## 2014-01-29 DIAGNOSIS — K922 Gastrointestinal hemorrhage, unspecified: Secondary | ICD-10-CM

## 2014-01-29 DIAGNOSIS — D490 Neoplasm of unspecified behavior of digestive system: Secondary | ICD-10-CM

## 2014-01-29 LAB — BASIC METABOLIC PANEL
BUN: 28 mg/dL — ABNORMAL HIGH (ref 6–23)
CO2: 22 meq/L (ref 19–32)
Calcium: 8.4 mg/dL (ref 8.4–10.5)
Chloride: 103 mEq/L (ref 96–112)
Creatinine, Ser: 2.01 mg/dL — ABNORMAL HIGH (ref 0.50–1.35)
GFR calc Af Amer: 33 mL/min — ABNORMAL LOW (ref >90)
GFR, EST NON AFRICAN AMERICAN: 28 mL/min — AB (ref >90)
GLUCOSE: 92 mg/dL (ref 70–99)
POTASSIUM: 4.2 meq/L (ref 3.7–5.3)
Sodium: 137 mEq/L (ref 137–147)

## 2014-01-29 LAB — CBC
HEMATOCRIT: 23.3 % — AB (ref 39.0–52.0)
HEMOGLOBIN: 7.7 g/dL — AB (ref 13.0–17.0)
MCH: 30.2 pg (ref 26.0–34.0)
MCHC: 33 g/dL (ref 30.0–36.0)
MCV: 91.4 fL (ref 78.0–100.0)
Platelets: 148 10*3/uL — ABNORMAL LOW (ref 150–400)
RBC: 2.55 MIL/uL — AB (ref 4.22–5.81)
RDW: 14.1 % (ref 11.5–15.5)
WBC: 7.1 10*3/uL (ref 4.0–10.5)

## 2014-01-29 LAB — PREPARE RBC (CROSSMATCH)

## 2014-01-29 LAB — GLUCOSE, CAPILLARY: Glucose-Capillary: 93 mg/dL (ref 70–99)

## 2014-01-29 MED ORDER — DEXTROSE-NACL 5-0.9 % IV SOLN
INTRAVENOUS | Status: DC
  Administered 2014-01-29: 18:00:00 via INTRAVENOUS
  Administered 2014-01-30: 20 mL/h via INTRAVENOUS

## 2014-01-29 NOTE — Progress Notes (Addendum)
[facility] PROGRESS NOTE  [name] [name] JXB:[id number] DOB: [birth year] DOA: 01/27/2014 PCP: No PCP Per Patient  Assessment/Plan: 80 y.o. male with past medical history of prostate cancer in remission who presented from [facility] ED to [facility] ED 01/27/2014 with ongoing nausea, vomiting and epigastric pain for past 5 days prior to this admission. He also had black tarry stools. CT abd revealed gastric outlet obstruction. CT was done in [place] not in [facility]. Blood work in [facility] revealed hemoglobin of 7.7 and creatinine of 2.13 (no other blood work available for comparison). GI consulted for input on management and further evaluation.    Principal Problem:  1. Upper GI bleed with gastric mass  - s/p EGD: Distal erosive esophagitis which was friable, Distal antral mass; pend biopsy   -patient initially refusing surgical evaluation or operation; but now agreed; consulted surgery  -continue protonix 40 mg IV Q 12 hours, appreciate GI consult and recomendations   2. Gastric outlet obstruction  - diet clears, continue IV fluids, zofran and phenergan for nausea/ vomiting and refractory N/V   3. Acute blood loss anemia due to GIB;  -TFsed 1 unit 2/15; Tf sing 1 unit 2/17;  TF prn  4. Nausea and vomiting  - secondary to gastric outlet obstruction,. antiemetics PRN   5. Acute renal failure ? Prerenal, + ACE  - continue IV fluids, follow up BMP in am; hold ACE, obtain UA; renal US  6. HTN hold ACE, cont monitor off meds, stable   Code Status: full Family Communication: d/w patient, wife, step son (indicate person spoken with, relationship, and if by phone, the number) Disposition Plan: home when ready   Consultants:  GI  Procedures:  Pend EGD   Antibiotics:  None  (indicate start date, and stop date if known)  HPI/Subjective: alert  Objective: Filed Vitals:   01/29/14 0639  BP: 117/49  Pulse: 74  Temp: 98.1 F (36.7 C)  Resp: 18    Intake/Output Summary (Last  24 hours) at 01/29/14 0849 Last data filed at 01/28/14 1859  Gross per 24 hour  Intake   1185 ml  Output      0 ml  Net   1185 ml   Filed Weights   01/27/14 0300 01/28/14 0459 01/29/14 0639  Weight: 60.328 kg (133 lb) 61.3 kg (135 lb 2.3 oz) 62 kg (136 lb 11 oz)    Exam:   General:  alert  Cardiovascular: s1,s2 rrr  Respiratory: few crackles in LL  Abdomen: soft, mild tender epigastric, ND  Musculoskeletal: no LE edema    Data Reviewed: Basic Metabolic Panel:  Recent Labs Lab 01/27/14 0508 01/28/14 1240 01/29/14 0415  NA 144 141 137  K 3.9 4.4 4.2  CL 102 106 103  CO2 29 22 22   GLUCOSE 140* 77 92  BUN 30* 26* 28*  CREATININE 2.13* 2.02* 2.01*  CALCIUM 9.1 8.8 8.4   Liver Function Tests:  Recent Labs Lab 01/27/14 0508  AST 13  ALT 7  ALKPHOS 37*  BILITOT <0.2*  PROT 6.0  ALBUMIN 3.1*   No results found for this basename: LIPASE, AMYLASE,  in the last 168 hours No results found for this basename: AMMONIA,  in the last 168 hours CBC:  Recent Labs Lab 01/27/14 0508  01/27/14 2000 01/28/14 0447 01/28/14 1240 01/28/14 1902 01/29/14 0415  WBC 8.2  --   --   --   --   --  7.1  NEUTROABS 6.6  --   --   --   --   --   --  HGB 7.7*  < > 8.7* 7.9* 9.3* 8.0* 7.7*  HCT 23.2*  < > 25.8* 23.7* 28.3* 24.0* 23.3*  MCV 90.3  --   --   --   --   --  91.4  PLT 172  --   --   --   --   --  148*  < > = values in this interval not displayed. Cardiac Enzymes: No results found for this basename: CKTOTAL, CKMB, CKMBINDEX, TROPONINI,  in the last 168 hours BNP (last 3 results) No results found for this basename: PROBNP,  in the last 8760 hours CBG:  Recent Labs Lab 01/28/14 0846 01/28/14 1110 01/29/14 0648  GLUCAP 89 74 93    No results found for this or any previous visit (from the past 240 hour(s)).   Studies: No results found.  Scheduled Meds: . feeding supplement (PRO-STAT SUGAR FREE 64)  30 mL Oral BID PC  . feeding supplement (RESOURCE BREEZE)   1 Container Oral BID WC  . lisinopril  40 mg Oral Daily  . pantoprazole (PROTONIX) IV  40 mg Intravenous Q12H  . tiZANidine  2 mg Oral Q8H   Continuous Infusions: . sodium chloride 75 mL/hr (01/28/14 2252)    Principal Problem:   GI bleed Active Problems:   Gastric outlet obstruction   Nausea and vomiting   Abdominal pain, epigastric   CAD (coronary artery disease) of artery bypass graft   Diabetes mellitus   Essential hypertension, benign   Chronic kidney disease   H/O prostate cancer    Time spent: >35 minutes     Kinnie Feil  [facility] Pager [phone]. If 7PM-7AM, please contact night-coverage at [email or web address], password [facility] 01/29/2014, 8:49 AM  LOS: 2 days

## 2014-01-29 NOTE — Consult Note (Signed)
Reason for Consult: distal antral mass, upper GI bleeding Referring Physician: Dr. Rowe Clack   HPI: [name] [name] is a 80 year old male with a history of open cholecystectomy, CAD/MI, HTN, prostate cancer, diabetes mellitus and CKD who initially presented to [facility] with abdominal.  Duration of symptoms is 1 month.  Onset was gradual.  Coarse is worsening.  Moderate in severity.  Associated with 50lbs weight loss over the past 5 months and constipation.  He denies melena or hematochezia.  He has been chewing tobacco for 62 years.  Denies alcohol use.  He characterizes his symptoms as burning pressure pain.  Location in the epigastric region without radiation.  He reports 1 episode of emesis on Saturday.  He denies anorexia or change in stool caliber. Modifying factors; zantac which was prescribed by primary care without any relief.  He takes plavix, reports last dose being on Saturday the 15th.  At [facility] he was found to have a hgb of 7.5 and positive guaiac.  A CT of abdomen and pelvis showed a gastric outlet obstruction.  He has transferred to [facility] for further evaluation.  He underwent a UGI with Dr. Penelope Coop which revealed a partially obstructing distal antral mass.  Therefore we have been consulted.  At present time, he is tolerating a clear liquid diet.  He has not had a bowel movement since Saturday.  H&H this AM 7.7/23.3.  He is currently receiving a unit of PRBCs.  He is unsure whether he wants to proceed with a surgery and wishes to take a day or two to decide with his wife and six children.    Past Medical History  Diagnosis Date  . CAD (coronary artery disease)   . Hypertension   . H/O prostate cancer     S/P Chemo and Radiation Rx  . Chronic kidney disease   . Diabetes     taken off meds    Past Surgical History  Procedure Laterality Date  . Excision of skin cancer on scalp    . Esophagogastroduodenoscopy N/A 01/28/2014    Procedure: ESOPHAGOGASTRODUODENOSCOPY (EGD);   Surgeon: Wonda Horner, MD;  Location: [facility] ENDOSCOPY;  Service: Endoscopy;  Laterality: N/A;  . Cholecystectomy    . Appendectomy      Family History  Problem Relation Age of Onset  . Cancer Father     Unknown type  . Cancer Mother     Unknown Type  . Diabetes Brother      X 2     Social History:  reports that he has never smoked. His smokeless tobacco use includes Chew. He reports that he does not drink alcohol or use illicit drugs.  Allergies:  Allergies  Allergen Reactions  . Salicylates Anaphylaxis    Allergy to Aspirin     Medications:  Scheduled Meds: . feeding supplement (PRO-STAT SUGAR FREE 64)  30 mL Oral BID PC  . feeding supplement (RESOURCE BREEZE)  1 Container Oral BID WC  . pantoprazole (PROTONIX) IV  40 mg Intravenous Q12H  . tiZANidine  2 mg Oral Q8H   Continuous Infusions: . dextrose 5 % and 0.9% NaCl     PRN Meds:.acetaminophen, acetaminophen, alum & mag hydroxide-simeth, HYDROmorphone (DILAUDID) injection, ondansetron (ZOFRAN) IV, ondansetron, oxyCODONE, promethazine  Results for orders placed during the hospital encounter of 01/27/14 (from the past 48 hour(s))  HEMOGLOBIN AND HEMATOCRIT, BLOOD     Status: Abnormal   Collection Time    01/27/14  8:00 PM  Result Value Ref Range   Hemoglobin 8.7 (*) 13.0 - 17.0 g/dL   Comment: DELTA CHECK NOTED     REPEATED TO VERIFY   HCT 25.8 (*) 39.0 - 52.0 %  HEMOGLOBIN AND HEMATOCRIT, BLOOD     Status: Abnormal   Collection Time    01/28/14  4:47 AM      Result Value Ref Range   Hemoglobin 7.9 (*) 13.0 - 17.0 g/dL   HCT 76.4 (*) 90.4 - 25.0 %  GLUCOSE, CAPILLARY     Status: None   Collection Time    01/28/14  8:46 AM      Result Value Ref Range   Glucose-Capillary 89  70 - 99 mg/dL  GLUCOSE, CAPILLARY     Status: None   Collection Time    01/28/14 11:10 AM      Result Value Ref Range   Glucose-Capillary 74  70 - 99 mg/dL   Comment 1 Notify RN    HEMOGLOBIN AND HEMATOCRIT, BLOOD     Status:  Abnormal   Collection Time    01/28/14 12:40 PM      Result Value Ref Range   Hemoglobin 9.3 (*) 13.0 - 17.0 g/dL   HCT 53.9 (*) 49.8 - 99.1 %  BASIC METABOLIC PANEL     Status: Abnormal   Collection Time    01/28/14 12:40 PM      Result Value Ref Range   Sodium 141  137 - 147 mEq/L   Potassium 4.4  3.7 - 5.3 mEq/L   Chloride 106  96 - 112 mEq/L   CO2 22  19 - 32 mEq/L   Glucose, Bld 77  70 - 99 mg/dL   BUN 26 (*) 6 - 23 mg/dL   Creatinine, Ser 0.68 (*) 0.50 - 1.35 mg/dL   Calcium 8.8  8.4 - 59.1 mg/dL   GFR calc non Af Amer 28 (*) >90 mL/min   GFR calc Af Amer 33 (*) >90 mL/min   Comment: (NOTE)     The eGFR has been calculated using the CKD EPI equation.     This calculation has not been validated in all clinical situations.     eGFR's persistently <90 mL/min signify possible Chronic Kidney     Disease.  HEMOGLOBIN AND HEMATOCRIT, BLOOD     Status: Abnormal   Collection Time    01/28/14  7:02 PM      Result Value Ref Range   Hemoglobin 8.0 (*) 13.0 - 17.0 g/dL   HCT 72.1 (*) 46.1 - 96.8 %  URINALYSIS, ROUTINE W REFLEX MICROSCOPIC     Status: Abnormal   Collection Time    01/28/14  8:42 PM      Result Value Ref Range   Color, Urine YELLOW  YELLOW   APPearance CLOUDY (*) CLEAR   Specific Gravity, Urine 1.014  1.005 - 1.030   pH 5.0  5.0 - 8.0   Glucose, UA NEGATIVE  NEGATIVE mg/dL   Hgb urine dipstick SMALL (*) NEGATIVE   Bilirubin Urine NEGATIVE  NEGATIVE   Ketones, ur NEGATIVE  NEGATIVE mg/dL   Protein, ur NEGATIVE  NEGATIVE mg/dL   Urobilinogen, UA 0.2  0.0 - 1.0 mg/dL   Nitrite NEGATIVE  NEGATIVE   Leukocytes, UA NEGATIVE  NEGATIVE  URINE MICROSCOPIC-ADD ON     Status: None   Collection Time    01/28/14  8:42 PM      Result Value Ref Range   Squamous Epithelial / LPF RARE  RARE   WBC, UA 0-2  <3 WBC/hpf   RBC / HPF 3-6  <3 RBC/hpf   Bacteria, UA RARE  RARE  BASIC METABOLIC PANEL     Status: Abnormal   Collection Time    01/29/14  4:15 AM      Result Value  Ref Range   Sodium 137  137 - 147 mEq/L   Potassium 4.2  3.7 - 5.3 mEq/L   Chloride 103  96 - 112 mEq/L   CO2 22  19 - 32 mEq/L   Glucose, Bld 92  70 - 99 mg/dL   BUN 28 (*) 6 - 23 mg/dL   Creatinine, Ser 8.71 (*) 0.50 - 1.35 mg/dL   Calcium 8.4  8.4 - 95.9 mg/dL   GFR calc non Af Amer 28 (*) >90 mL/min   GFR calc Af Amer 33 (*) >90 mL/min   Comment: (NOTE)     The eGFR has been calculated using the CKD EPI equation.     This calculation has not been validated in all clinical situations.     eGFR's persistently <90 mL/min signify possible Chronic Kidney     Disease.  CBC     Status: Abnormal   Collection Time    01/29/14  4:15 AM      Result Value Ref Range   WBC 7.1  4.0 - 10.5 K/uL   RBC 2.55 (*) 4.22 - 5.81 MIL/uL   Hemoglobin 7.7 (*) 13.0 - 17.0 g/dL   HCT 74.7 (*) 18.5 - 50.1 %   MCV 91.4  78.0 - 100.0 fL   MCH 30.2  26.0 - 34.0 pg   MCHC 33.0  30.0 - 36.0 g/dL   RDW 58.6  82.5 - 74.9 %   Platelets 148 (*) 150 - 400 K/uL  GLUCOSE, CAPILLARY     Status: None   Collection Time    01/29/14  6:48 AM      Result Value Ref Range   Glucose-Capillary 93  70 - 99 mg/dL   Comment 1 Notify RN    PREPARE RBC (CROSSMATCH)     Status: None   Collection Time    01/29/14  8:54 AM      Result Value Ref Range   Order Confirmation ORDER PROCESSED BY BLOOD BANK      US Renal  01/29/2014   CLINICAL DATA:  Acute renal injury.  EXAM: RENAL/URINARY TRACT ULTRASOUND COMPLETE  COMPARISON:  None.  FINDINGS: Right Kidney:  Length: 9.7 cm. Renal cortical thinning and prominent renal sinus fat. The renal cortex is also slightly echogenic. No focal lesions or hydronephrosis.  Left Kidney:  Length: 9.6 cm. Renal cortical thinning and increased echogenicity but no hydronephrosis or renal mass.  Bladder:  Normal.  IMPRESSION: Age related renal cortical thinning.  No mass or hydronephrosis.   Electronically Signed   By: Loralie Champagne M.D.   On: 01/29/2014 11:07    Review of Systems   Constitutional: Positive for weight loss. Negative for fever, chills, malaise/fatigue and diaphoresis.  HENT: Negative.   Eyes: Negative.   Respiratory: Negative.   Cardiovascular: Negative.   Gastrointestinal: Positive for abdominal pain and constipation. Negative for nausea, vomiting, diarrhea, blood in stool and melena.  Genitourinary: Negative.   Musculoskeletal: Negative.   Skin: Negative.   Neurological: Negative.  Negative for weakness.  Endo/Heme/Allergies: Negative.   Psychiatric/Behavioral: Negative.    Blood pressure 146/64, pulse 66, temperature 97.8 F (36.6 C), temperature source Oral, resp. rate 18, height  $'5\' 9"'Y$  (1.753 m), weight 136 lb 11 oz (62 kg), SpO2 98.00%. Physical Exam  Constitutional: He is oriented to person, place, and time. He appears well-developed and well-nourished. No distress.  Cardiovascular: Normal rate, regular rhythm, normal heart sounds and intact distal pulses.  Exam reveals no gallop and no friction rub.   No murmur heard. Respiratory: Effort normal and breath sounds normal. No respiratory distress. He has no wheezes. He has no rales. He exhibits no tenderness.  GI: Soft. Bowel sounds are normal. He exhibits no distension and no mass. There is no tenderness. There is no rebound and no guarding.  RUQ GB scar  Musculoskeletal: He exhibits no edema and no tenderness.  Neurological: He is alert and oriented to person, place, and time.  Skin: Skin is warm and dry. No rash noted. He is not diaphoretic. No erythema. No pallor.  Psychiatric: He has a normal mood and affect. His behavior is normal. Judgment and thought content normal.    Assessment/Plan: HTN DM Type II CKD Hx of open cholecystectomy CAD/MI(plavix last dose 2/15) Partially obstructing distal antral mass The patient is unsure whether he wishes to proceed with surgical intervention.  He would require a Billroth II operation.  I discussed his options in length, however, I am still  concerned that he does not fully comprehend the risks of surgery versus non operative or palliative care measures.  He is agreeable to a meeting the palliative care team.  i have left a message with the [facility] team.  He does not appear to be actively bleeding at this time. He is hemodynamically stable and off Pavix for 2 days now.  Agree with blood transfusion, monitoring hemoglobin and hematocrit, IV hydration, would not advance diet.  He may also need a cardiac clearance.  Thank you for the consult.  Surgery will follow.    Duel Conrad ANP-BC 01/29/2014, 1:43 PM

## 2014-01-29 NOTE — Consult Note (Signed)
I saw the patient, participated in the history, exam and medical decision making, and concur with the physician assistant's note above.  Add anemia and protein calorie malnutrition to assessment  Lots of family members at bedside when I visited pt.  Alert, nad, cachetic Soft, nt, nd. Well healed subcostal incision.   Advised pt and family we needed to wait for bx results to come back before I make my final recommendation b/c it may change operative approach and what is required.   Family very confused about plan and thought pt had to make a decision about surgery by tomorrow and were upset that he would not be able to get any additional blood if needed.   Had to do a lot of pt and family recovery.   Explained that we are still gathering info to make a informed recommendation. Explained that what other physician probably meant was trying to determine if pt was even interested in surgery. Moreover, my guess is the "no additional blood" comment was meant that if pt declines surgery (even if we offer it) additional blood transfusions would be futile if the offending lesion is not removed and I explained this to family. Showed endoscopy images to pt and family and discussed stomach anatomy and how mass is causing a problem  I need to review Gerty CT with radiology Rec palliative care consult to discuss what pt's long term goals are and to get onboard.  At this point we need to wait on path results in order to get true idea of risk/benefits/complications of surgery and then we can have family meeting to determine best course of action given pt's age, functional status and comorbidities.   In interim - clears and nutritional shakes such as resource, etc Will follow PT consult   Leighton Ruff. Redmond Pulling, MD, FACS General, Bariatric, & Minimally Invasive Surgery [facility], [place]

## 2014-01-30 DIAGNOSIS — E43 Unspecified severe protein-calorie malnutrition: Secondary | ICD-10-CM | POA: Diagnosis present

## 2014-01-30 DIAGNOSIS — K3189 Other diseases of stomach and duodenum: Secondary | ICD-10-CM

## 2014-01-30 DIAGNOSIS — K319 Disease of stomach and duodenum, unspecified: Secondary | ICD-10-CM

## 2014-01-30 DIAGNOSIS — E46 Unspecified protein-calorie malnutrition: Secondary | ICD-10-CM

## 2014-01-30 LAB — TYPE AND SCREEN
ABO/RH(D): O POS
ANTIBODY SCREEN: NEGATIVE
UNIT DIVISION: 0
Unit division: 0

## 2014-01-30 LAB — CBC
HCT: 25.9 % — ABNORMAL LOW (ref 39.0–52.0)
Hemoglobin: 8.8 g/dL — ABNORMAL LOW (ref 13.0–17.0)
MCH: 30.6 pg (ref 26.0–34.0)
MCHC: 34 g/dL (ref 30.0–36.0)
MCV: 89.9 fL (ref 78.0–100.0)
Platelets: 143 10*3/uL — ABNORMAL LOW (ref 150–400)
RBC: 2.88 MIL/uL — ABNORMAL LOW (ref 4.22–5.81)
RDW: 14 % (ref 11.5–15.5)
WBC: 5.9 10*3/uL (ref 4.0–10.5)

## 2014-01-30 LAB — PSA: PSA: 0.03 ng/mL — ABNORMAL LOW (ref <=4.00)

## 2014-01-30 NOTE — Progress Notes (Signed)
Pt tolerating clears. No n/v.   Alert, nad Soft, nd.   Path showed benign mucosa with hyperplastic cells and goblet cell metaplasia - no definitive cancer but can't ro.  Reviewed CT abd/pel REPORT from [place].  No mass seen, no LAD  Distal antral mass causing gastric outlet obstruction  I will plan to have a family conference tomorrow morning prior to the palliative care meeting. We will discuss potential surgical plan which would include exploratory laparotomy with gastrotomy and wedge resection of mass. We will discuss risk and benefits and potential complications of that procedure. He will then discuss with palliative care. We will then await his decision of whether or not he would like to proceed with surgery.  Leighton Ruff. Redmond Pulling, MD, FACS General, Bariatric, & Minimally Invasive Surgery [facility] Surgery, [place]

## 2014-01-30 NOTE — Progress Notes (Signed)
2 Days Post-Op  Subjective: Pt tolerating clears, wants diet increased, good appetite.  Family at bedside.    Objective: Vital signs in last 24 hours: Temp:  [97.3 F (36.3 C)-98.7 F (37.1 C)] 97.3 F (36.3 C) (02/18 0507) Pulse Rate:  [66-75] 70 (02/18 0507) Resp:  [18-20] 18 (02/18 0507) BP: (105-151)/(50-69) 105/50 mmHg (02/18 0507) SpO2:  [95 %-99 %] 99 % (02/18 0507) Weight:  [138 lb (62.596 kg)] 138 lb (62.596 kg) (02/18 0507) Last BM Date: 01/28/14  Intake/Output from previous day: 02/17 0701 - 02/18 0700 In: 2396.3 [P.O.:1020; I.V.:1051.3; Blood:325] Out: -  Intake/Output this shift:   Physical Exam  Constitutional: He is oriented to person, place, and time. He appears well-developed and well-nourished. No distress.  GI: Soft. Bowel sounds are normal. He exhibits no distension and no mass. There is no tenderness. There is no rebound and no guarding.  RUQ GB scar   Lab Results:   Recent Labs  01/29/14 0415 01/30/14 0435  WBC 7.1 5.9  HGB 7.7* 8.8*  HCT 23.3* 25.9*  PLT 148* 143*   BMET  Recent Labs  01/28/14 1240 01/29/14 0415  NA 141 137  K 4.4 4.2  CL 106 103  CO2 22 22  GLUCOSE 77 92  BUN 26* 28*  CREATININE 2.02* 2.01*  CALCIUM 8.8 8.4   PT/INR No results found for this basename: LABPROT, INR,  in the last 72 hours ABG No results found for this basename: PHART, PCO2, PO2, HCO3,  in the last 72 hours  Studies/Results: US Renal  01/29/2014   CLINICAL DATA:  Acute renal injury.  EXAM: RENAL/URINARY TRACT ULTRASOUND COMPLETE  COMPARISON:  None.  FINDINGS: Right Kidney:  Length: 9.7 cm. Renal cortical thinning and prominent renal sinus fat. The renal cortex is also slightly echogenic. No focal lesions or hydronephrosis.  Left Kidney:  Length: 9.6 cm. Renal cortical thinning and increased echogenicity but no hydronephrosis or renal mass.  Bladder:  Normal.  IMPRESSION: Age related renal cortical thinning.  No mass or hydronephrosis.   Electronically  Signed   By: Kalman Jewels M.D.   On: 01/29/2014 11:07    Anti-infectives: Anti-infectives   None      Assessment/Plan: HTN  DM Type II  CKD  Hx of open cholecystectomy  CAD/MI(plavix last dose 2/15)  Partially obstructing distal antral mass PCM  We will discuss the patients pathology results today(discussed with pathologist) and make further recommendations for surgical intervention. I relayed this to the patient and his family.  Dr. Redmond Pulling be come back later today.   Would recommend nutritional shakes such as resource, ensure and so forth.    LOS: 3 days    Darrell Hauk ANP-BC 01/30/2014 10:11 AM

## 2014-01-30 NOTE — Progress Notes (Signed)
Thank you for consulting the Palliative Medicine Team at [facility] to meet your patient's and family's needs.   The reason that you asked [place] to see your patient is  For Clarification of GOC and options  We have scheduled your patient for a meeting: Tomorrow morning at 1000  (01-31-19)   The Surrogate decision make is: His wife   Other family members that need to be present:  Several children and stepchildren will be present   Your patient is able/unable to participate: yes is able  Additional Narrative:  Family await feedback from the surgeon today.  Vinie Sill, NP Palliative Medicine Team Pager # [phone] Team Phone # [phone]

## 2014-01-30 NOTE — Progress Notes (Addendum)
[facility] PROGRESS NOTE Interim History: 80 y.o. male with past medical history of prostate cancer in remission who presented from [facility] ED to [facility] ED 01/27/2014 with ongoing nausea, vomiting and epigastric pain for past 5 days prior to this admission. He also had black tarry stools. CT abd revealed gastric outlet obstruction. CT was done in [place] not in [facility]. Blood work in [facility] revealed hemoglobin of 7.7 and creatinine of 2.13 (no other blood work available for comparison). GI consulted EGD showed gastric mass bx pending surgery awaiting bx and PMT consult.    Assessment/Plan:  Upper GI bleed with gastric mass:  - s/p EGD: Distal antral mass; pend biopsy, firm to palpation, and friable. - Consulted surgery awaiting Bx, consult PMT. - Continue protonix 40 mg IV Q 12 hours.  Gastric outlet obstruction  - Due to gastric mass diet clears, continue IV fluids, zofran and phenergan for nausea/ vomiting and refractory N/V. - check PSA, has history of prostate cancer.  Severe protein caloric malnutrition: - Due to malignancy. - Regular diet.  Acute blood loss anemia due to GIB;  -TFsed 1 unit 2/15; Tf sing 1 unit 2/17; TF prn   Nausea and vomiting  - secondary to gastric outlet obstruction,. antiemetics PRN   Acute On chronic renal failure: - multifactorial due to Prerenal, + ACE  - continue IV fluids, mild improvement on Cr. - unknown baseline Cr.  6. HTN hold ACE, cont monitor off meds, stable.  Code Status: full  Family Communication: d/w patient, wife, step son  Disposition Plan: home when ready    Consultants:  GI  surgery  Procedures:  EGD 2.17.2015  Antibiotics:  None  HPI/Subjective: No complains. Poor understanding of condition  Objective: Filed Vitals:   01/29/14 1536 01/29/14 1602 01/29/14 2009 01/30/14 0507  BP: 151/59 151/60 140/69 105/50  Pulse: 71 67 74 70  Temp: 98 F (36.7 C) 97.7 F (36.5 C) 98 F (36.7 C) 97.3 F  (36.3 C)  TempSrc: Oral Oral Oral Oral  Resp: 18 20 18 18   Height:      Weight:    62.596 kg (138 lb)  SpO2: 98% 98% 98% 99%    Intake/Output Summary (Last 24 hours) at 01/30/14 0854 Last data filed at 01/30/14 0700  Gross per 24 hour  Intake 2396.25 ml  Output      0 ml  Net 2396.25 ml   Filed Weights   01/28/14 0459 01/29/14 0639 01/30/14 0507  Weight: 61.3 kg (135 lb 2.3 oz) 62 kg (136 lb 11 oz) 62.596 kg (138 lb)    Exam:  General: Alert, awake, oriented x3, in no acute distress.  HEENT: No bruits, no goiter.  Heart: Regular rate and rhythm, without murmurs, rubs, gallops.  Lungs: Good air movement, clear to auscultation. Abdomen: Soft, nontender, nondistended, positive bowel sounds.     Data Reviewed: Basic Metabolic Panel:  Recent Labs Lab 01/27/14 0508 01/28/14 1240 01/29/14 0415  NA 144 141 137  K 3.9 4.4 4.2  CL 102 106 103  CO2 29 22 22   GLUCOSE 140* 77 92  BUN 30* 26* 28*  CREATININE 2.13* 2.02* 2.01*  CALCIUM 9.1 8.8 8.4   Liver Function Tests:  Recent Labs Lab 01/27/14 0508  AST 13  ALT 7  ALKPHOS 37*  BILITOT <0.2*  PROT 6.0  ALBUMIN 3.1*   No results found for this basename: LIPASE, AMYLASE,  in the last 168 hours No results found for this basename: AMMONIA,  in the last 168 hours CBC:  Recent Labs Lab 01/27/14 0508  01/28/14 0447 01/28/14 1240 01/28/14 1902 01/29/14 0415 01/30/14 0435  WBC 8.2  --   --   --   --  7.1 5.9  NEUTROABS 6.6  --   --   --   --   --   --   HGB 7.7*  < > 7.9* 9.3* 8.0* 7.7* 8.8*  HCT 23.2*  < > 23.7* 28.3* 24.0* 23.3* 25.9*  MCV 90.3  --   --   --   --  91.4 89.9  PLT 172  --   --   --   --  148* 143*  < > = values in this interval not displayed. Cardiac Enzymes: No results found for this basename: CKTOTAL, CKMB, CKMBINDEX, TROPONINI,  in the last 168 hours BNP (last 3 results) No results found for this basename: PROBNP,  in the last 8760 hours CBG:  Recent Labs Lab 01/28/14 0846  01/28/14 1110 01/29/14 0648  GLUCAP 89 74 93    No results found for this or any previous visit (from the past 240 hour(s)).   Studies: US Renal  01/29/2014   CLINICAL DATA:  Acute renal injury.  EXAM: RENAL/URINARY TRACT ULTRASOUND COMPLETE  COMPARISON:  None.  FINDINGS: Right Kidney:  Length: 9.7 cm. Renal cortical thinning and prominent renal sinus fat. The renal cortex is also slightly echogenic. No focal lesions or hydronephrosis.  Left Kidney:  Length: 9.6 cm. Renal cortical thinning and increased echogenicity but no hydronephrosis or renal mass.  Bladder:  Normal.  IMPRESSION: Age related renal cortical thinning.  No mass or hydronephrosis.   Electronically Signed   By: Kalman Jewels M.D.   On: 01/29/2014 11:07    Scheduled Meds: . feeding supplement (PRO-STAT SUGAR FREE 64)  30 mL Oral BID PC  . feeding supplement (RESOURCE BREEZE)  1 Container Oral BID WC  . pantoprazole (PROTONIX) IV  40 mg Intravenous Q12H  . tiZANidine  2 mg Oral Q8H   Continuous Infusions: . dextrose 5 % and 0.9% NaCl 75 mL/hr at 01/29/14 Carlin, Kollins Fenter  [facility] Pager [phone]. If 8PM-8AM, please contact night-coverage at [email or web address], password [facility] 01/30/2014, 8:54 AM  LOS: 3 days

## 2014-01-30 NOTE — Progress Notes (Signed)
Had pt sign release of information and faxed to [facility].

## 2014-01-30 NOTE — Consult Note (Signed)
Reason for Consult: PreOp clearance Referring Physician:   Riyan Haile is an 80 y.o. male.  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  He's been chewing tobacco for 62 years.  No cigarettes.  Has a history of CAD, HTN, prostate cancer, CKD, DM.  He reports having a "light" MI in the 50's.  He is active and works around the house.  No stress test in the last 15-20 years if, at all.  He was admitted 01/27/14 with N,V, epigastric pain, GIB.  EGD revealed gastric mass which is pending biopsy.  We are asked to consult for preop clearance.  The patient currently denies nausea/vomiting(in the last three days), fever, chest pain, shortness of breath, orthopnea, dizziness, PND, cough, congestion, abdominal pain, hematochezia, melena, lower extremity edema, claudication.      Past Medical History  Diagnosis Date  . CAD (coronary artery disease)   . Hypertension   . H/O prostate cancer     S/P Chemo and Radiation Rx  . Chronic kidney disease   . Diabetes     taken off meds    Past Surgical History  Procedure Laterality Date  . Excision of skin cancer on scalp    . Esophagogastroduodenoscopy N/A 01/28/2014    Procedure: ESOPHAGOGASTRODUODENOSCOPY (EGD);  Surgeon: Wonda Horner, MD;  Location: [facility] ENDOSCOPY;  Service: Endoscopy;  Laterality: N/A;  . Cholecystectomy    . Appendectomy      Family History  Problem Relation Age of Onset  . Cancer Father     Unknown type  . Cancer Mother     Unknown Type  . Diabetes Brother      X 2     Social History:  reports that he has never smoked. His smokeless tobacco use includes Chew. He reports that he does not drink alcohol or use illicit drugs.  Allergies:  Allergies  Allergen Reactions  . Salicylates Anaphylaxis    Allergy to Aspirin     Medications:  Scheduled Meds: . feeding supplement (PRO-STAT SUGAR FREE 64)  30 mL Oral BID PC  . feeding supplement (RESOURCE BREEZE)  1 Container Oral BID  WC  . pantoprazole (PROTONIX) IV  40 mg Intravenous Q12H  . tiZANidine  2 mg Oral Q8H   Continuous Infusions: . dextrose 5 % and 0.9% NaCl 20 mL/hr (01/30/14 0830)   PRN Meds:.acetaminophen, acetaminophen, alum & mag hydroxide-simeth, HYDROmorphone (DILAUDID) injection, ondansetron (ZOFRAN) IV, ondansetron, oxyCODONE, promethazine   Results for orders placed during the hospital encounter of 01/27/14 (from the past 48 hour(s))  HEMOGLOBIN AND HEMATOCRIT, BLOOD     Status: Abnormal   Collection Time    01/28/14  7:02 PM      Result Value Ref Range   Hemoglobin 8.0 (*) 13.0 - 17.0 g/dL   HCT 24.0 (*) 39.0 - 52.0 %  URINALYSIS, ROUTINE W REFLEX MICROSCOPIC     Status: Abnormal   Collection Time    01/28/14  8:42 PM      Result Value Ref Range   Color, Urine YELLOW  YELLOW   APPearance CLOUDY (*) CLEAR   Specific Gravity, Urine 1.014  1.005 - 1.030   pH 5.0  5.0 - 8.0   Glucose, UA NEGATIVE  NEGATIVE mg/dL   Hgb urine dipstick SMALL (*) NEGATIVE   Bilirubin Urine NEGATIVE  NEGATIVE   Ketones, ur NEGATIVE  NEGATIVE mg/dL   Protein, ur NEGATIVE  NEGATIVE  mg/dL   Urobilinogen, UA 0.2  0.0 - 1.0 mg/dL   Nitrite NEGATIVE  NEGATIVE   Leukocytes, UA NEGATIVE  NEGATIVE  URINE MICROSCOPIC-ADD ON     Status: None   Collection Time    01/28/14  8:42 PM      Result Value Ref Range   Squamous Epithelial / LPF RARE  RARE   WBC, UA 0-2  <3 WBC/hpf   RBC / HPF 3-6  <3 RBC/hpf   Bacteria, UA RARE  RARE  BASIC METABOLIC PANEL     Status: Abnormal   Collection Time    01/29/14  4:15 AM      Result Value Ref Range   Sodium 137  137 - 147 mEq/L   Potassium 4.2  3.7 - 5.3 mEq/L   Chloride 103  96 - 112 mEq/L   CO2 22  19 - 32 mEq/L   Glucose, Bld 92  70 - 99 mg/dL   BUN 28 (*) 6 - 23 mg/dL   Creatinine, Ser 3.30 (*) 0.50 - 1.35 mg/dL   Calcium 8.4  8.4 - 83.5 mg/dL   GFR calc non Af Amer 28 (*) >90 mL/min   GFR calc Af Amer 33 (*) >90 mL/min   Comment: (NOTE)     The eGFR has been  calculated using the CKD EPI equation.     This calculation has not been validated in all clinical situations.     eGFR's persistently <90 mL/min signify possible Chronic Kidney     Disease.  CBC     Status: Abnormal   Collection Time    01/29/14  4:15 AM      Result Value Ref Range   WBC 7.1  4.0 - 10.5 K/uL   RBC 2.55 (*) 4.22 - 5.81 MIL/uL   Hemoglobin 7.7 (*) 13.0 - 17.0 g/dL   HCT 77.5 (*) 84.5 - 39.0 %   MCV 91.4  78.0 - 100.0 fL   MCH 30.2  26.0 - 34.0 pg   MCHC 33.0  30.0 - 36.0 g/dL   RDW 59.2  70.6 - 05.2 %   Platelets 148 (*) 150 - 400 K/uL  GLUCOSE, CAPILLARY     Status: None   Collection Time    01/29/14  6:48 AM      Result Value Ref Range   Glucose-Capillary 93  70 - 99 mg/dL   Comment 1 Notify RN    PREPARE RBC (CROSSMATCH)     Status: None   Collection Time    01/29/14  8:54 AM      Result Value Ref Range   Order Confirmation ORDER PROCESSED BY BLOOD BANK    CBC     Status: Abnormal   Collection Time    01/30/14  4:35 AM      Result Value Ref Range   WBC 5.9  4.0 - 10.5 K/uL   RBC 2.88 (*) 4.22 - 5.81 MIL/uL   Hemoglobin 8.8 (*) 13.0 - 17.0 g/dL   HCT 80.3 (*) 66.0 - 06.5 %   MCV 89.9  78.0 - 100.0 fL   MCH 30.6  26.0 - 34.0 pg   MCHC 34.0  30.0 - 36.0 g/dL   RDW 90.1  91.7 - 01.5 %   Platelets 143 (*) 150 - 400 K/uL    US Renal  01/29/2014   CLINICAL DATA:  Acute renal injury.  EXAM: RENAL/URINARY TRACT ULTRASOUND COMPLETE  COMPARISON:  None.  FINDINGS: Right Kidney:  Length: 9.7 cm.  Renal cortical thinning and prominent renal sinus fat. The renal cortex is also slightly echogenic. No focal lesions or hydronephrosis.  Left Kidney:  Length: 9.6 cm. Renal cortical thinning and increased echogenicity but no hydronephrosis or renal mass.  Bladder:  Normal.  IMPRESSION: Age related renal cortical thinning.  No mass or hydronephrosis.   Electronically Signed   By: Kalman Jewels M.D.   On: 01/29/2014 11:07    Review of Systems  Constitutional: Negative for  fever and diaphoresis.  HENT: Negative for congestion.   Respiratory: Negative for cough and shortness of breath.   Cardiovascular: Positive for leg swelling. Negative for chest pain, orthopnea and PND.  Gastrointestinal: Positive for melena. Negative for nausea, vomiting and abdominal pain.  Genitourinary: Negative for hematuria.  Musculoskeletal: Negative for back pain.  Neurological: Negative for dizziness.  All other systems reviewed and are negative.   Blood pressure 115/49, pulse 65, temperature 97.7 F (36.5 C), temperature source Oral, resp. rate 18, height _0  (1.753 m), weight 138 lb (62.596 kg), SpO2 94.00%. Physical Exam  Nursing note and vitals reviewed. Constitutional: He is oriented to person, place, and time. He appears well-developed and well-nourished. No distress.  HENT:  Head: Atraumatic.  Eyes: EOM are normal. Pupils are equal, round, and reactive to light. No scleral icterus.  Neck: Normal range of motion. Neck supple. No JVD present.  Cardiovascular: Normal rate, regular rhythm and S1 normal.   Murmur heard.  Systolic murmur is present with a grade of 1/6  Pulses:      Radial pulses are 2+ on the right side, and 2+ on the left side.       Dorsalis pedis pulses are 1+ on the right side, and 1+ on the left side.  No carotid bruits.  Respiratory: Effort normal and breath sounds normal. No respiratory distress. He has no wheezes. He has no rales.  GI: Soft. Bowel sounds are normal. He exhibits no distension. There is no tenderness.  Musculoskeletal: He exhibits edema.  1+ right.  None on the left    Lymphadenopathy:    He has no cervical adenopathy.  Neurological: He is alert and oriented to person, place, and time. He exhibits normal muscle tone.  Skin: Skin is warm and dry.  Psychiatric: He has a normal mood and affect.    Assessment/Plan: Principal Problem:   Gastric mass Active Problems:   GI bleed   Gastric outlet obstruction   Nausea and  vomiting   Abdominal pain, epigastric   CAD (coronary artery disease) of artery bypass graft   Diabetes mellitus   Essential hypertension, benign   Chronic kidney disease   H/O prostate cancer   Protein-calorie malnutrition, severe   Plan:    80 yo male with a history of CAD(light MI in 77's), HTN, prostate cancer, CKD, DM, chewing tobacco.  He was admitted 01/27/14 with N,V, epigastric pain, GIB.  EGD revealed gastric mass which is pending biopsy.  BP appears to be stable.   Will obtain EKG.  Sinus rhythm on tele with [facility].  If he wishes to proceed with surgery, I would recommend a Lexiscan Myovue.  He does not appear to have any cardiac symptoms suggesting ischemia.  Tarri Fuller 01/30/2014, 2:54 PM    The patient was seen, examined and discussed with Tarri Fuller, PA-C and I agree with the above.   In summary, Mr Dani is a 80 year old gentleman with h/o chewing tobacco, hypertension, ? Prior MI many years ago who  presented with GI bleeding and was diagnosed with a benign gastric mass. We are consulted for a preoperative evaluation prior to a possible mass resection. The patient is very active despite his age. He denies any chest and SOB only on moderate exertion.  He has no signs of CHF or acute coronary syndrome. He can certainly achieve at least 4 METS. His ECG is completely normal. He is considered a low risk for an intermediate risk surgery.  If he decide to undergo this surgery there would be no contraindication from cardiology standpoint.    Thank you for the referral, please call us with any questions.   Ena Dawley, Lemmie Evens 01/30/2014

## 2014-01-30 NOTE — Progress Notes (Signed)
Records came by Fax text paged surgical PA to make aware of records here now.

## 2014-01-31 DIAGNOSIS — E119 Type 2 diabetes mellitus without complications: Secondary | ICD-10-CM

## 2014-01-31 DIAGNOSIS — Z515 Encounter for palliative care: Secondary | ICD-10-CM

## 2014-01-31 DIAGNOSIS — R531 Weakness: Secondary | ICD-10-CM

## 2014-01-31 DIAGNOSIS — R5383 Other fatigue: Secondary | ICD-10-CM

## 2014-01-31 DIAGNOSIS — R5381 Other malaise: Secondary | ICD-10-CM

## 2014-01-31 MED ORDER — ONDANSETRON 4 MG PO TBDP: 8.0000 mg | ORAL_TABLET | Freq: Three times a day (TID) | ORAL | Status: AC | PRN

## 2014-01-31 MED ORDER — LORAZEPAM 1 MG PO TABS: 1.0000 mg | ORAL_TABLET | Freq: Four times a day (QID) | ORAL | Status: DC | PRN

## 2014-01-31 MED ORDER — PANTOPRAZOLE SODIUM 40 MG PO TBEC: 40.0000 mg | DELAYED_RELEASE_TABLET | Freq: Two times a day (BID) | ORAL | Status: AC

## 2014-01-31 MED ORDER — PROMETHAZINE HCL 12.5 MG PO TABS: 25.0000 mg | ORAL_TABLET | Freq: Four times a day (QID) | ORAL | Status: AC | PRN

## 2014-01-31 MED ORDER — BISACODYL 10 MG RE SUPP: 10.0000 mg | Freq: Every day | RECTAL | Status: DC | PRN

## 2014-01-31 MED ORDER — BOOST / RESOURCE BREEZE PO LIQD
1.0000 | Freq: Three times a day (TID) | ORAL | Status: DC
  Administered 2014-01-31 (×2): 1 via ORAL

## 2014-01-31 MED ORDER — PRO-STAT SUGAR FREE PO LIQD: 30.0000 mL | Freq: Two times a day (BID) | ORAL | Status: AC

## 2014-01-31 MED ORDER — MORPHINE SULFATE (CONCENTRATE) 10 MG /0.5 ML PO SOLN: 5.0000 mg | ORAL | Status: DC | PRN

## 2014-01-31 NOTE — Consult Note (Signed)
Patient [name] [name]      DOB: [birth year]      LY:[id number]     Consult Note from the Palliative Medicine Team at [facility] Requested by: Dr Venetia Constable     PCP: No PCP Per Patient Reason for Consultation:     Phone [phone]  Assessment of patients Current state:  Patient and family faced with decisions regarding surgical interventions and continued medical interventions for newly diagnosed gastric mass vs shift to comfort care and dc home with hospice.  Consult is for review of medical treatment options, clarification of goals of care and end of life issues, disposition and options, and symptom recommendation.  This NP Wadie Lessen reviewed medical records, received report from team, assessed the patient and then meet at the patient's bedside along with his wife and large family  to discuss diagnosis prognosis, GOC, EOL wishes disposition and options.   A detailed discussion was had today regarding advanced directives.  Concepts specific to code status, artifical feeding and hydration, continued IV antibiotics and rehospitalization was had.  The difference between a aggressive medical intervention path  and a palliative comfort care path for this patient at this time was had.  Values and goals of care important to patient and family were attempted to be elicited.  Concept of Hospice and Palliative Care were discussed  Natural trajectory and expectations at EOL were discussed.  Questions and concerns addressed. Family encouraged to call with questions or concerns.  PMT will continue to support holistically.    Goals of Care: 1.  Code Status:DNR/DNI-comfort is main focus of care   2. Scope of Treatment: 1. Vital Signs: per unit  2. Respiratory/Oxygen:for comfort only 3. Nutritional Support/Tube Feeds:no artificial feeding now or in the future        -comfort feeds/full liquid-liquid supplements; boost,ensure  4. Antibiotics: oral only if indicated for  comfort 5. Blood Products:none 6. [id number] 7. Review of Medications to be discontinued:minimize for comfort 8. Labs:none 9. Telemetry:none 10. Consults:Home hospice   4. Disposition:  Home with hospice, Case management aware    3. Symptom Management:   1. Anxiety/Agitation: Ativan 1 mg every 6 hrs prn 2. Pain:Roxanol 5 mg every 2 hrs prn 3. Weakness-exercise as tolerated  4. Bowel Regimen: Dulcolax supp daily prn 4.  Gastric dis motility/obstruction: full liquid diet as tolerated, small frequent meals/grazing, chew and spit, protein shakes  4. Psychosocial:  Emotional support to patient and large family.  Patient is tearful as he shares feeling regarding his own mortality.  All family members express support for patients decision for comfort  5. Spiritual:  Chaplain consulted    Patient Documents Completed or Given: Document Given Completed  Advanced Directives Pkt    MOST  yes  DNR    Gone from [facility]    Hard Choices      Brief HPI: [name] [name] is a 80 y.o. male who was taken to the [facility] ED due to complaints of nausea and vomiting and Epigastric ABD pain and black tarry stools x 5 days. He denies having any hematemesis or fevers or chills or chest pain or SOB. He was seen during the week by his PCP for his symptoms and was found to have jaundice, so the PCP sent blood work and stool studies per the patient. They were contacted and told to go to the ED Yesterday. In the ED in [place] he was evaluated and was found to have a hemoglobin of  7.5 and his last hemoglobin on record 6 months ago had been 9.3. A rectal exam and FOBT was done and was HEME +. He was sent for a Ct scan of his ABD without Contrast due to his CKD and the results revealed a Gastric Outlet Obstruction. Arrangements were made to transfer him to [facility] for a GI consultation and for further evaluation and treatment.   Biopsy negative, patient refuses surgical interventions and wishes  to "go home" and focus of comfort with the help of hospice services    ROS: weakness, fatigue, poor appetite with weight loss    PMH:  Past Medical History  Diagnosis Date  . CAD (coronary artery disease)   . Hypertension   . H/O prostate cancer     S/P Chemo and Radiation Rx  . Chronic kidney disease   . Diabetes     taken off meds     PSH: Past Surgical History  Procedure Laterality Date  . Excision of skin cancer on scalp    . Esophagogastroduodenoscopy N/A 01/28/2014    Procedure: ESOPHAGOGASTRODUODENOSCOPY (EGD);  Surgeon: Wonda Horner, MD;  Location: [facility] ENDOSCOPY;  Service: Endoscopy;  Laterality: N/A;  . Cholecystectomy    . Appendectomy     I have reviewed the FH and SH and  If appropriate update it with new information. Allergies  Allergen Reactions  . Salicylates Anaphylaxis    Allergy to Aspirin    Scheduled Meds: . feeding supplement (PRO-STAT SUGAR FREE 64)  30 mL Oral BID PC  . feeding supplement (RESOURCE BREEZE)  1 Container Oral BID WC  . pantoprazole (PROTONIX) IV  40 mg Intravenous Q12H  . tiZANidine  2 mg Oral Q8H   Continuous Infusions:  PRN Meds:.acetaminophen, acetaminophen, alum & mag hydroxide-simeth, HYDROmorphone (DILAUDID) injection, ondansetron (ZOFRAN) IV, ondansetron, oxyCODONE, promethazine    BP 112/69  Pulse 68  Temp(Src) 97.6 F (36.4 C) (Oral)  Resp 18  Ht 5\' 9"  (1.753 m)  Wt 61.417 kg (135 lb 6.4 oz)  BMI 19.99 kg/m2  SpO2 98%   PPS:30 %   Intake/Output Summary (Last 24 hours) at 01/31/14 1056 Last data filed at 01/31/14 0900  Gross per 24 hour  Intake   1540 ml  Output      0 ml  Net   1540 ml   Physical Exam:  General: chronically ill appearing, NAD, OOB to chair HEENT:  Moist membranes, no exudate Chest:   Decreased in bases, CTA CVS: RRR Abdomen:soft, mild tenderness on exam, +BS Ext: without edema Neuro:alert and oriented X3  Labs: CBC    Component Value Date/Time   WBC 5.9 01/30/2014 0435   RBC  2.88* 01/30/2014 0435   HGB 8.8* 01/30/2014 0435   HCT 25.9* 01/30/2014 0435   PLT 143* 01/30/2014 0435   MCV 89.9 01/30/2014 0435   MCH 30.6 01/30/2014 0435   MCHC 34.0 01/30/2014 0435   RDW 14.0 01/30/2014 0435   LYMPHSABS 0.6* 01/27/2014 0508   MONOABS 0.9 01/27/2014 0508   EOSABS 0.0 01/27/2014 0508   BASOSABS 0.0 01/27/2014 0508    BMET    Component Value Date/Time   NA 137 01/29/2014 0415   K 4.2 01/29/2014 0415   CL 103 01/29/2014 0415   CO2 22 01/29/2014 0415   GLUCOSE 92 01/29/2014 0415   BUN 28* 01/29/2014 0415   CREATININE 2.01* 01/29/2014 0415   CALCIUM 8.4 01/29/2014 0415   GFRNONAA 28* 01/29/2014 0415   GFRAA 33* 01/29/2014 0415  CMP     Component Value Date/Time   NA 137 01/29/2014 0415   K 4.2 01/29/2014 0415   CL 103 01/29/2014 0415   CO2 22 01/29/2014 0415   GLUCOSE 92 01/29/2014 0415   BUN 28* 01/29/2014 0415   CREATININE 2.01* 01/29/2014 0415   CALCIUM 8.4 01/29/2014 0415   PROT 6.0 01/27/2014 0508   ALBUMIN 3.1* 01/27/2014 0508   AST 13 01/27/2014 0508   ALT 7 01/27/2014 0508   ALKPHOS 37* 01/27/2014 0508   BILITOT <0.2* 01/27/2014 0508   GFRNONAA 28* 01/29/2014 0415   GFRAA 33* 01/29/2014 0415     Time In Time Out Total Time Spent with Patient Total Overall Time  1000 1130 80 min 90 min    Greater than 50%  of this time was spent counseling and coordinating care related to the above assessment and plan.  Wadie Lessen NP  Palliative Medicine Team Team Phone # [phone] Pager [phone]  Discussed with DR Venetia Constable

## 2014-01-31 NOTE — Discharge Summary (Signed)
Physician Discharge Summary  Nassir Quintin [id number] DOB: 1925-09-22 DOA: 01/27/2014  PCP: No PCP Per Patient  Admit date: 01/27/2014 Discharge date: 02/01/2014  Time spent: 35 minutes  Recommendations for Outpatient Follow-up:  1. Follow up with Hospice at home. 2. PCP in 1 week.  Discharge Diagnoses:  Principal Problem:   Gastric mass Active Problems:   GI bleed   Gastric outlet obstruction   Nausea and vomiting   Abdominal pain, epigastric   CAD (coronary artery disease) of artery bypass graft   Diabetes mellitus   Essential hypertension, benign   Chronic kidney disease   H/O prostate cancer   Protein-calorie malnutrition, severe   Palliative care encounter   Weakness generalized   Discharge Condition: guarded  Diet recommendation: liq diet  Filed Weights   01/30/14 0507 01/31/14 0621 02/01/14 0525  Weight: 62.596 kg (138 lb) 61.417 kg (135 lb 6.4 oz) 61.4 kg (135 lb 5.8 oz)    History of present illness:  80 y.o. male who was taken to the [facility] ED due to complaints of nausea and vomiting and Epigastric ABD pain and black tarry stools x 5 days. He denies having any hematemesis or fevers or chills or chest pain or SOB. He was seen during the week by his PCP for his symptoms and was found to have jaundice, so the PCP sent blood work and stool studies per the patient. They were contacted and told to go to the ED Yesterday. In the ED in [place] he was evaluated and was found to have a hemoglobin of 7.5 and his last hemoglobin on record 6 months ago had been 9.3. A rectal exam and FOBT was done and was HEME +. He was sent for a Ct scan of his ABD without Contrast due to his CKD and the results revealed a Gastric Outlet Obstruction   Hospital Course:  Upper GI bleed with gastric mass:  - s/p EGD: Distal antral mass; Bx did not show malignancy, firm to palpation, and friable.  - Consulted surgery recommended liq diet. He refused surgery and he was  deemed poor surgical candidate. - consult PMT meeting on 2.19.2015.  - PSA < 0.05.  - he wants to go home.   - meet with hospice and deemed a candidate for hospice at home.  Gastric outlet obstruction  - Due to gastric mass tolerated diet clears,  - started zofran and phernergan. - morphine for pain.  Severe protein caloric malnutrition:  - Due to mass. - ensure tid.  Acute blood loss anemia due to GIB;  -Transfused 1 unit 2/15; Tf sing 1 unit 2/17;   Nausea and vomiting  - secondary to gastric outlet obstruction,. antiemetics PRN   Acute On chronic renal failure:  - multifactorial due to Prerenal, + ACE  - continue IV fluids, mild improvement on Cr.  - unknown baseline Cr.   HTN: - d/c antihypertensive. - d/w family  Procedures:  EGD  Consultations:  Gastroenterology  Surgery  Discharge Exam: Filed Vitals:   02/01/14 0525  BP: 106/42  Pulse: 72  Temp: 98 F (36.7 C)  Resp: 17    General: A&O x3 Cardiovascular: RRR Respiratory: good air movement CTA B/L  Discharge Instructions      Discharge Orders   Future Orders Complete By Expires   Diet - low sodium heart healthy  As directed    Increase activity slowly  As directed        Medication List    STOP taking  these medications       clopidogrel 75 MG tablet  Commonly known as:  PLAVIX      TAKE these medications       feeding supplement (PRO-STAT SUGAR FREE 64) Liqd  Take 30 mLs by mouth 2 (two) times daily after a meal.     lisinopril 40 MG tablet  Commonly known as:  PRINIVIL,ZESTRIL  Take 40 mg by mouth daily.     morphine CONCENTRATE 10 mg / 0.5 ml concentrated solution  Take 0.25 mLs (5 mg total) by mouth every 2 (two) hours as needed for moderate pain or shortness of breath.     ondansetron 4 MG disintegrating tablet  Commonly known as:  ZOFRAN ODT  Take 2 tablets (8 mg total) by mouth every 8 (eight) hours as needed for nausea or vomiting.     pantoprazole 40 MG tablet   Commonly known as:  PROTONIX  Take 1 tablet (40 mg total) by mouth 2 (two) times daily.     promethazine 12.5 MG tablet  Commonly known as:  PHENERGAN  Take 2 tablets (25 mg total) by mouth every 6 (six) hours as needed for nausea or vomiting.     ranitidine 300 MG tablet  Commonly known as:  ZANTAC  Take 300 mg by mouth at bedtime.     tiZANidine 2 MG tablet  Commonly known as:  ZANAFLEX  Take 2 mg by mouth every 8 (eight) hours.       Allergies  Allergen Reactions  . Salicylates Anaphylaxis    Allergy to Aspirin    Follow-up Information   Follow up with No PCP Per Patient.   Specialty:  General Practice   Contact information:   [place] [address] [phone]        The results of significant diagnostics from this hospitalization (including imaging, microbiology, ancillary and laboratory) are listed below for reference.    Significant Diagnostic Studies: US Renal  01/29/2014   CLINICAL DATA:  Acute renal injury.  EXAM: RENAL/URINARY TRACT ULTRASOUND COMPLETE  COMPARISON:  None.  FINDINGS: Right Kidney:  Length: 9.7 cm. Renal cortical thinning and prominent renal sinus fat. The renal cortex is also slightly echogenic. No focal lesions or hydronephrosis.  Left Kidney:  Length: 9.6 cm. Renal cortical thinning and increased echogenicity but no hydronephrosis or renal mass.  Bladder:  Normal.  IMPRESSION: Age related renal cortical thinning.  No mass or hydronephrosis.   Electronically Signed   By: Kalman Jewels M.D.   On: 01/29/2014 11:07    Microbiology: No results found for this or any previous visit (from the past 240 hour(s)).   Labs: Basic Metabolic Panel:  Recent Labs Lab 01/27/14 0508 01/28/14 1240 01/29/14 0415  NA 144 141 137  K 3.9 4.4 4.2  CL 102 106 103  CO2 29 22 22   GLUCOSE 140* 77 92  BUN 30* 26* 28*  CREATININE 2.13* 2.02* 2.01*  CALCIUM 9.1 8.8 8.4   Liver Function Tests:  Recent Labs Lab 01/27/14 0508  AST 13   ALT 7  ALKPHOS 37*  BILITOT <0.2*  PROT 6.0  ALBUMIN 3.1*   No results found for this basename: LIPASE, AMYLASE,  in the last 168 hours No results found for this basename: AMMONIA,  in the last 168 hours CBC:  Recent Labs Lab 01/27/14 0508  01/28/14 0447 01/28/14 1240 01/28/14 1902 01/29/14 0415 01/30/14 0435  WBC 8.2  --   --   --   --  7.1 5.9  NEUTROABS 6.6  --   --   --   --   --   --   HGB 7.7*  < > 7.9* 9.3* 8.0* 7.7* 8.8*  HCT 23.2*  < > 23.7* 28.3* 24.0* 23.3* 25.9*  MCV 90.3  --   --   --   --  91.4 89.9  PLT 172  --   --   --   --  148* 143*  < > = values in this interval not displayed. Cardiac Enzymes: No results found for this basename: CKTOTAL, CKMB, CKMBINDEX, TROPONINI,  in the last 168 hours BNP: BNP (last 3 results) No results found for this basename: PROBNP,  in the last 8760 hours CBG:  Recent Labs Lab 01/28/14 0846 01/28/14 1110 01/29/14 0648  GLUCAP 89 74 93       Signed:  FELIZ ORTIZ, Johnie Stadel  [facility] 02/01/2014, 8:37 AM

## 2014-01-31 NOTE — Consult Note (Signed)
I have reviewed this case with our NP and agree with the Assessment and Plan as stated.  Angeleena Dueitt L. Aliscia Clayton, MD MBA The Palliative Medicine Team at [place] Team Phone: [phone] Pager: [phone]   

## 2014-01-31 NOTE — Progress Notes (Signed)
3 Days Post-Op  Subjective: No n/v. Tolerated clears.   Objective: Vital signs in last 24 hours: Temp:  [97.6 F (36.4 C)-98.1 F (36.7 C)] 97.6 F (36.4 C) (02/19 0621) Pulse Rate:  [65-85] 68 (02/19 0621) Resp:  [18] 18 (02/19 0621) BP: (112-126)/(47-69) 112/69 mmHg (02/19 0621) SpO2:  [94 %-98 %] 98 % (02/19 0621) Weight:  [135 lb 6.4 oz (61.417 kg)] 135 lb 6.4 oz (61.417 kg) (02/19 0621) Last BM Date: 01/28/14  Intake/Output from previous day: 02/18 0701 - 02/19 0700 In: 990 [P.O.:730; I.V.:260] Out: -  Intake/Output this shift: Total I/O In: 560 [P.O.:560] Out: -   Alert nad cachetic Exam deferred  Lab Results:   Recent Labs  01/29/14 0415 01/30/14 0435  WBC 7.1 5.9  HGB 7.7* 8.8*  HCT 23.3* 25.9*  PLT 148* 143*   BMET  Recent Labs  01/28/14 1240 01/29/14 0415  NA 141 137  K 4.4 4.2  CL 106 103  CO2 22 22  GLUCOSE 77 92  BUN 26* 28*  CREATININE 2.02* 2.01*  CALCIUM 8.8 8.4   PT/INR No results found for this basename: LABPROT, INR,  in the last 72 hours ABG No results found for this basename: PHART, PCO2, PO2, HCO3,  in the last 72 hours  Studies/Results: US Renal  01/29/2014   CLINICAL DATA:  Acute renal injury.  EXAM: RENAL/URINARY TRACT ULTRASOUND COMPLETE  COMPARISON:  None.  FINDINGS: Right Kidney:  Length: 9.7 cm. Renal cortical thinning and prominent renal sinus fat. The renal cortex is also slightly echogenic. No focal lesions or hydronephrosis.  Left Kidney:  Length: 9.6 cm. Renal cortical thinning and increased echogenicity but no hydronephrosis or renal mass.  Bladder:  Normal.  IMPRESSION: Age related renal cortical thinning.  No mass or hydronephrosis.   Electronically Signed   By: Kalman Jewels M.D.   On: 01/29/2014 11:07    Anti-infectives: Anti-infectives   None      Assessment/Plan: Anemia PCMN DM 2 HTN Gastric mass causing partial gastric outlet obstruction  s/p Procedure(s): ESOPHAGOGASTRODUODENOSCOPY (EGD)  (N/A)  Spent 20 minutes counseling patient in extended family regarding management of distal gastric mass. used analogies Such as a partially clogged sink. Discuss surgery which would involve exploratory laparotomy, opening up the stomach, and wedge resection of the mass with interoperative frozen. Explained that I would not proceed with distal gastrectomy with reconstruction. Also discussed nonsurgical option of a clear-full liquid diet. I do not believe he would be able to tolerate thick regular feeds without having ongoing symptoms. Also discussed the possibility of the gastric mass continuing to increase in size. We discussed the risk and benefits of surgery including but not limited to bleeding, infection, abscess, hernia formation, blood clot formation, gastric leak, gastric ileus, prolonged hospitalization, pneumonia, failure to thrive, death. We also discussed the typical recovery from this type of surgery. After answering all the patient and family questions, the patient states that he would not like to have surgery and would like to go home.  I discussed importance of eating small meals throughout the day and not eating bulky meals. We discussed that he should stick to liquids and full liquids. Explained that at some point he may develop intolerance to that as well. All of their questions were asked and answered. Palliative care is going to finish out a family meeting.  Signing off. Please call with questions  Leighton Ruff. Redmond Pulling, MD, FACS General, Bariatric, & Minimally Invasive Surgery [facility], [place]  LOS: 4 days    Gayland Curry 01/31/2014

## 2014-01-31 NOTE — Progress Notes (Signed)
[facility] PROGRESS NOTE Interim History: 80 y.o. male with past medical history of prostate cancer in remission who presented from [facility] ED to [facility] ED 01/27/2014 with ongoing nausea, vomiting and epigastric pain for past 5 days prior to this admission. He also had black tarry stools. CT abd revealed gastric outlet obstruction. CT was done in [place] not in [facility]. Blood work in [facility] revealed hemoglobin of 7.7 and creatinine of 2.13 (no other blood work available for comparison). GI consulted EGD showed gastric mass bx pending surgery awaiting bx and PMT consult.    Assessment/Plan:  Upper GI bleed with gastric mass:  - s/p EGD: Distal antral mass; pend biopsy, firm to palpation, and friable. - Consulted surgery awaiting Bx, consult PMT meeting on 2.19.2015. - Change to oral protonix. hbg stable. - PSA < 0.05. - he wants to go home.  Gastric outlet obstruction  - Due to gastric mass diet clears, continue IV fluids, zofran and phenergan for nausea/ vomiting and refractory N/V. - check PSA, has history of prostate cancer. - tolerating diet.  Severe protein caloric malnutrition: - Due to malignancy. - Regular diet.  Acute blood loss anemia due to GIB;  -Transfused 1 unit 2/15; Tf sing 1 unit 2/17; TF prn   Nausea and vomiting  - secondary to gastric outlet obstruction,. antiemetics PRN   Acute On chronic renal failure: - multifactorial due to Prerenal, + ACE  - continue IV fluids, mild improvement on Cr. - unknown baseline Cr.  6. HTN hold ACE, cont monitor off meds, stable.  Code Status: full  Family Communication: d/w patient, wife, step son  Disposition Plan: home when ready    Consultants:  GI  surgery  Procedures:  EGD 2.17.2015  Antibiotics:  None  HPI/Subjective: No complains.  Wants to go home.  Objective: Filed Vitals:   01/30/14 0900 01/30/14 1300 01/30/14 2235 01/31/14 0621  BP: 114/69 115/49 126/47 112/69  Pulse: 67 65 85  68  Temp: 97.5 F (36.4 C) 97.7 F (36.5 C) 98.1 F (36.7 C) 97.6 F (36.4 C)  TempSrc: Oral Oral Oral Oral  Resp: 18 18 18 18   Height:      Weight:    61.417 kg (135 lb 6.4 oz)  SpO2: 98% 94% 98% 98%    Intake/Output Summary (Last 24 hours) at 01/31/14 0708 Last data filed at 01/31/14 0700  Gross per 24 hour  Intake    990 ml  Output      0 ml  Net    990 ml   Filed Weights   01/29/14 0639 01/30/14 0507 01/31/14 0621  Weight: 62 kg (136 lb 11 oz) 62.596 kg (138 lb) 61.417 kg (135 lb 6.4 oz)    Exam:  General: Alert, awake, oriented x3, in no acute distress. cachectic HEENT: No bruits, no goiter.  Heart: Regular rate and rhythm, without murmurs, rubs, gallops.  Lungs: Good air movement, clear to auscultation. Abdomen: Soft, nontender, nondistended, positive bowel sounds.     Data Reviewed: Basic Metabolic Panel:  Recent Labs Lab 01/27/14 0508 01/28/14 1240 01/29/14 0415  NA 144 141 137  K 3.9 4.4 4.2  CL 102 106 103  CO2 29 22 22   GLUCOSE 140* 77 92  BUN 30* 26* 28*  CREATININE 2.13* 2.02* 2.01*  CALCIUM 9.1 8.8 8.4   Liver Function Tests:  Recent Labs Lab 01/27/14 0508  AST 13  ALT 7  ALKPHOS 37*  BILITOT <0.2*  PROT 6.0  ALBUMIN 3.1*  No results found for this basename: LIPASE, AMYLASE,  in the last 168 hours No results found for this basename: AMMONIA,  in the last 168 hours CBC:  Recent Labs Lab 01/27/14 0508  01/28/14 0447 01/28/14 1240 01/28/14 1902 01/29/14 0415 01/30/14 0435  WBC 8.2  --   --   --   --  7.1 5.9  NEUTROABS 6.6  --   --   --   --   --   --   HGB 7.7*  < > 7.9* 9.3* 8.0* 7.7* 8.8*  HCT 23.2*  < > 23.7* 28.3* 24.0* 23.3* 25.9*  MCV 90.3  --   --   --   --  91.4 89.9  PLT 172  --   --   --   --  148* 143*  < > = values in this interval not displayed. Cardiac Enzymes: No results found for this basename: CKTOTAL, CKMB, CKMBINDEX, TROPONINI,  in the last 168 hours BNP (last 3 results) No results found for this  basename: PROBNP,  in the last 8760 hours CBG:  Recent Labs Lab 01/28/14 0846 01/28/14 1110 01/29/14 0648  GLUCAP 89 74 93    No results found for this or any previous visit (from the past 240 hour(s)).   Studies: US Renal  01/29/2014   CLINICAL DATA:  Acute renal injury.  EXAM: RENAL/URINARY TRACT ULTRASOUND COMPLETE  COMPARISON:  None.  FINDINGS: Right Kidney:  Length: 9.7 cm. Renal cortical thinning and prominent renal sinus fat. The renal cortex is also slightly echogenic. No focal lesions or hydronephrosis.  Left Kidney:  Length: 9.6 cm. Renal cortical thinning and increased echogenicity but no hydronephrosis or renal mass.  Bladder:  Normal.  IMPRESSION: Age related renal cortical thinning.  No mass or hydronephrosis.   Electronically Signed   By: Kalman Jewels M.D.   On: 01/29/2014 11:07    Scheduled Meds: . feeding supplement (PRO-STAT SUGAR FREE 64)  30 mL Oral BID PC  . feeding supplement (RESOURCE BREEZE)  1 Container Oral BID WC  . pantoprazole (PROTONIX) IV  40 mg Intravenous Q12H  . tiZANidine  2 mg Oral Q8H   Continuous Infusions: . dextrose 5 % and 0.9% NaCl 20 mL/hr at 01/30/14 [place], [name] [name]  [facility] Pager [phone]. If 8PM-8AM, please contact night-coverage at [email or web address], password [facility] 01/31/2014, 7:08 AM  LOS: 4 days

## 2014-01-31 NOTE — Progress Notes (Signed)
Chaplain met with pt, stepson, and wife.  Wife was asleep.  However, pt and son talked with chaplain in detail about their desire for the pt to return home.  Pt feels that he "will be better" at home over a medical facility.  Chaplain listened empathically and engaged in dialogue with their decision to go home.  Family seems content with their decision and appreciated the chaplain visit.

## 2014-01-31 NOTE — Progress Notes (Signed)
UR completed Wafaa Deemer K. Rahsaan Weakland, RN, BSN, [place], CCM  01/31/2014 2:58 PM

## 2014-01-31 NOTE — Care Management Note (Addendum)
  Page 2 of 2   02/01/2014     1:59:15 PM   CARE MANAGEMENT NOTE 02/01/2014  Patient:  [name],[name]   Account Number:  [id number]  Date Initiated:  01/31/2014  Documentation initiated by:  Killian Schwer  Subjective/Objective Assessment:   Admitted with GI Bleed, gastric outlet obstruction, wt loss 30 lbs, hx/o prostate ca in remission     Action/Plan:   cm to follow for disposition needs   Anticipated DC Date:  01/31/2014   Anticipated DC Plan:  HOME W [facility] CARE         Choice offered to / List presented to:          [facility] arranged  HH-1 RN      Status of service:  Completed, signed off Medicare Important Message given?   (If response is "NO", the following Medicare IM given date fields will be blank) Date Medicare IM given:   Date Additional Medicare IM given:    Discharge Disposition:  [place]  Per UR Regulation:  Reviewed for med. necessity/level of care/duration of stay  If discussed at [place] of Stay Meetings, dates discussed:    Comments:  [place] rquesting copy of EGD:  faxed by CM. [name] RN, BSN, MSHL, CCM 02/01/2014   02/01/2014 D/c summary and Hospice order faxed to [place] D/c date 02/01/2014 Mariann Laster RN, BSN, [facility], [place] 02/01/2014   01/31/2014 DME delivered to home today and ready for patients d/c home. Order for [facility] pending and needs to be faxed to [facility] when MD orders. Hospice Nurse plans to make home visit day of d/c home but needs order (pending) ADD:  [place], RN, BSN, MSHL, CCM 01/31/2014  LOS NOTE:  Upper GI bleed with gastric mass: - s/p EGD: Distal antral mass; pend biopsy, firm to palpation, and friable. Palliative consult:  completed Plan:  Home with Home Hospice ADD: 0-1 Zariya [name] RN, BSN, [place], CCM 01/31/2014  Home Hospice Services set up: [place] [address] [phone] phone [phone] fax  DME Provider:   [facility] through [facility] will order DME:  Hospital bed, OBT, walker, BSC, W/C DTR going home to be in house to receive DME and take down old bed. DME to be delivered and set up at approximatley 1:30 - 2pm Per contact [facility] so patient may d/c home today. [name] RN, BSN, [place], [place] 01/31/2014

## 2014-02-01 DIAGNOSIS — E43 Unspecified severe protein-calorie malnutrition: Secondary | ICD-10-CM

## 2014-02-01 LAB — GLUCOSE, CAPILLARY: GLUCOSE-CAPILLARY: 124 mg/dL — AB (ref 70–99)

## 2014-02-01 MED ORDER — MORPHINE SULFATE (CONCENTRATE) 10 MG /0.5 ML PO SOLN: 5.0000 mg | ORAL | Status: AC | PRN

## 2014-02-01 NOTE — Progress Notes (Signed)
Patient is up in the recliner chair this morning. Patient complains of no pain or discomfort. No bleeding noted or stated from patient. Will continue to monitor patient to the end of the shift.

## 2014-08-13 DEATH — deceased

## 2014-10-23 IMAGING — US US RENAL
1 series · 14 of 25 positions shown · non-contrast
Comparison: None.

CLINICAL DATA: Acute renal injury.

EXAM:
RENAL/URINARY TRACT ULTRASOUND COMPLETE

[Series 1: us renal · 0.20mm/px · 14 of 33 slices shown]
[im 1/33]
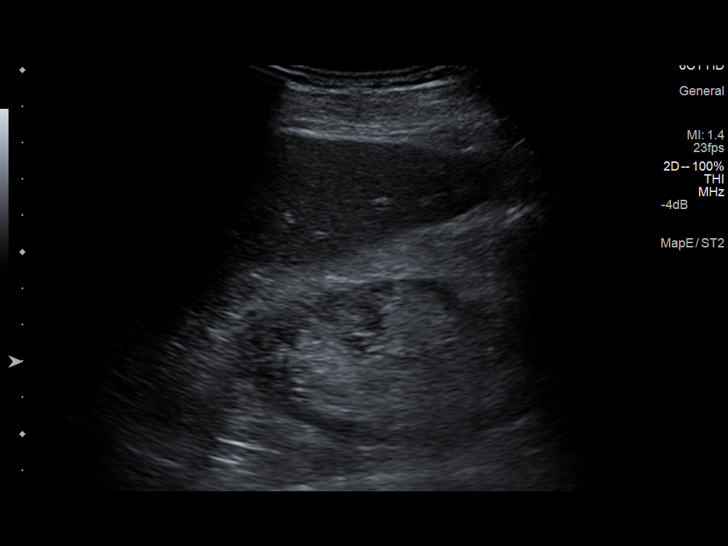
[im 3/33]
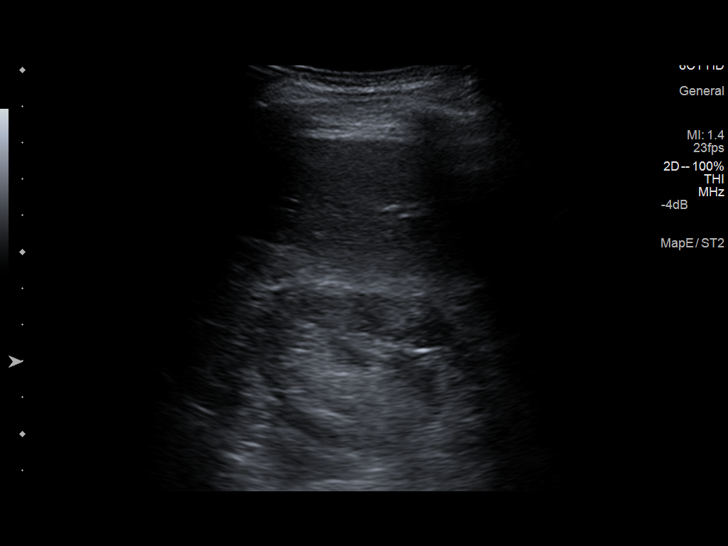
[im 6/33]
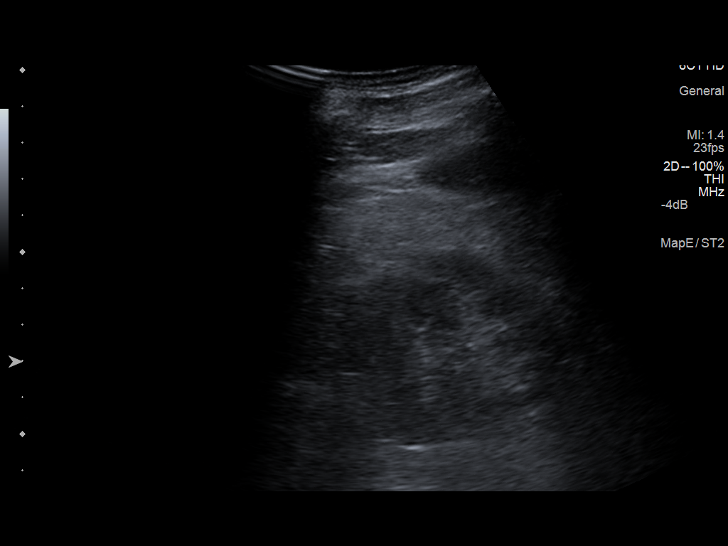
[im 9/33]
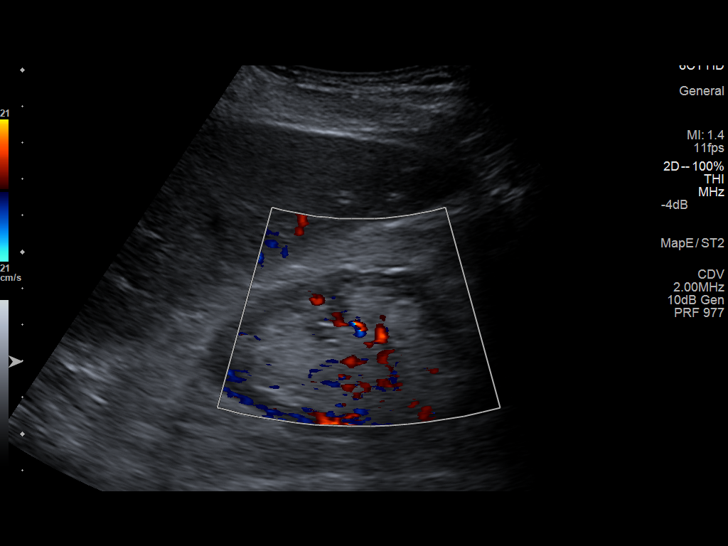
[im 11/33]
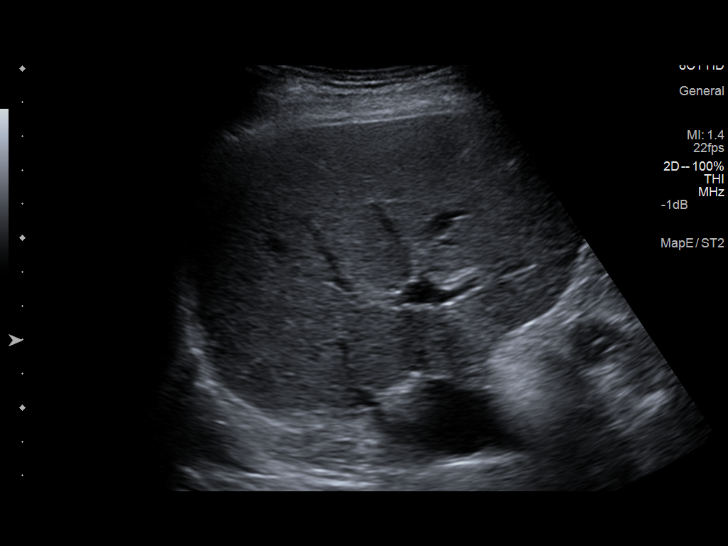
[im 13/33]
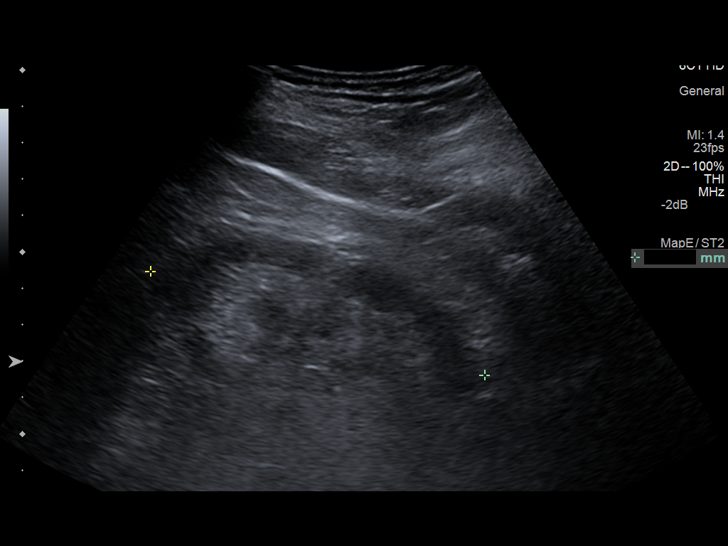
[im 15/33]
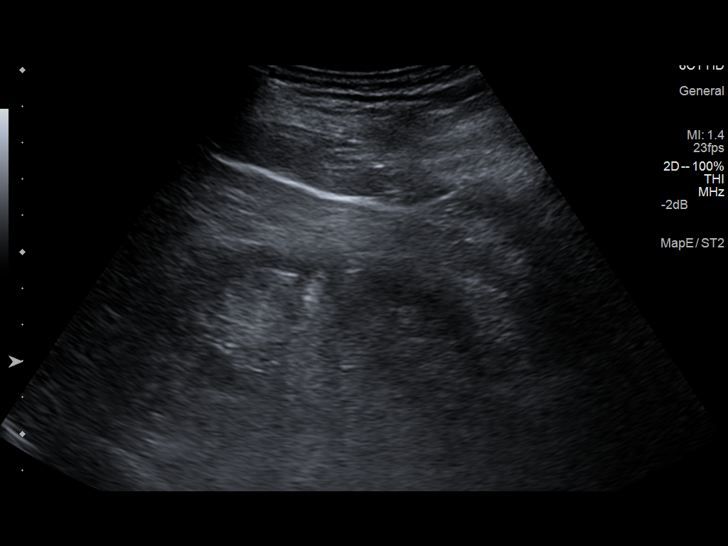
[im 18/33]
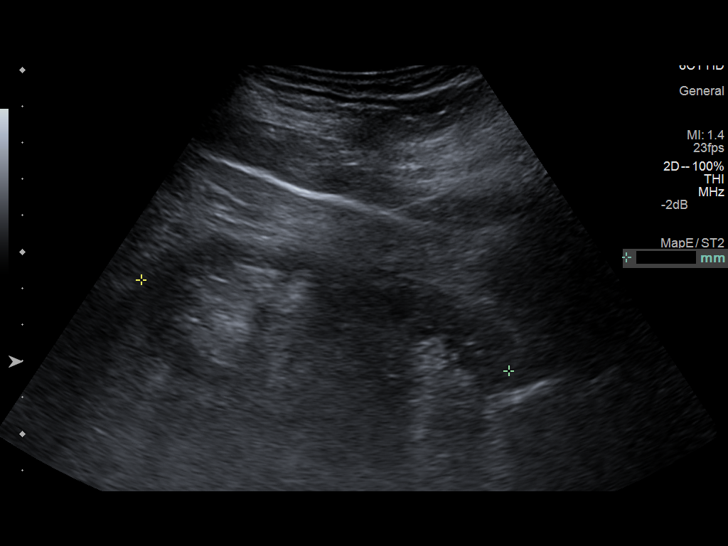
[im 21/33]
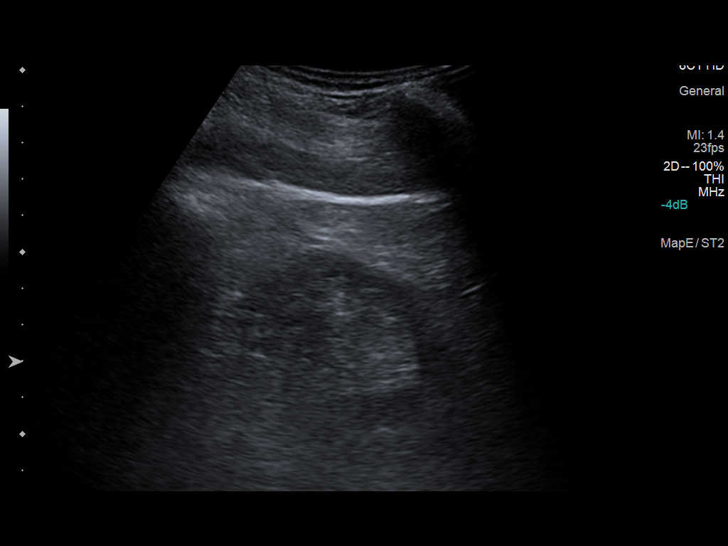
[im 22/33]
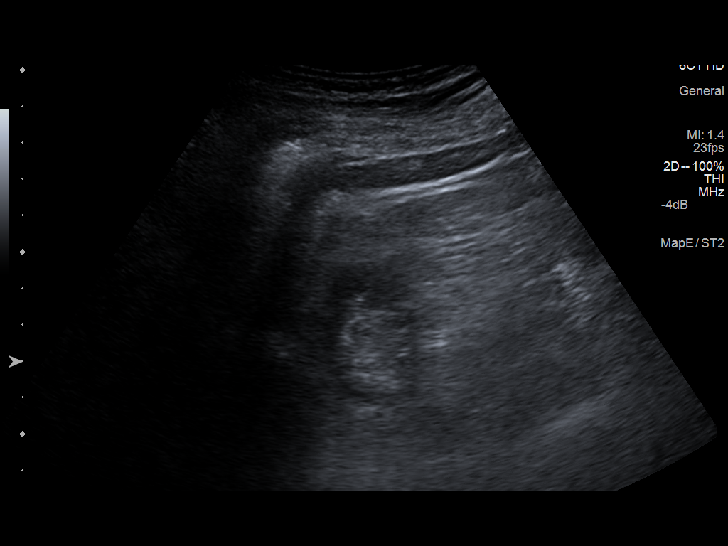
[im 25/33]
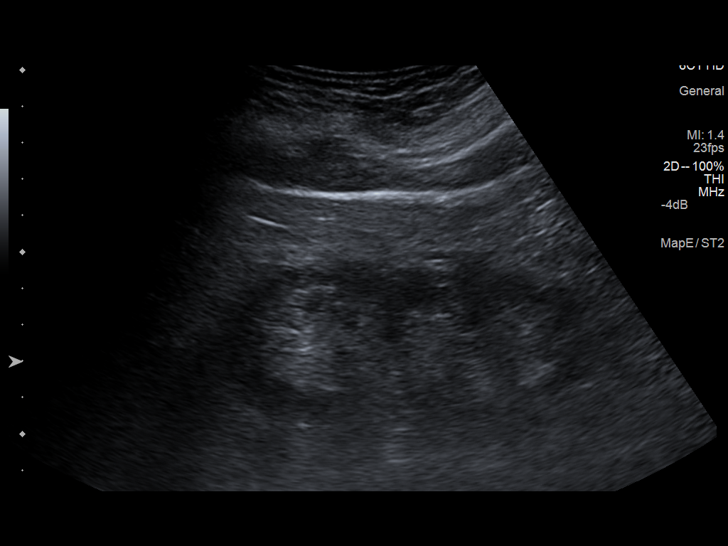
[im 27/33]
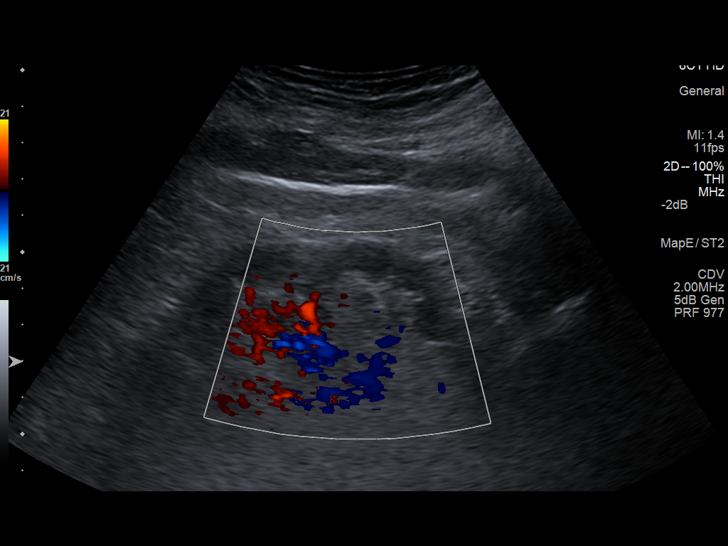
[im 30/33]
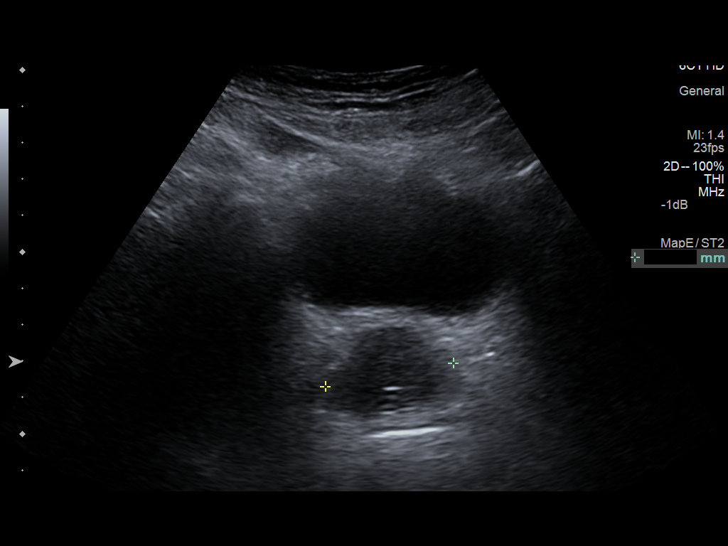
[im 33/33]
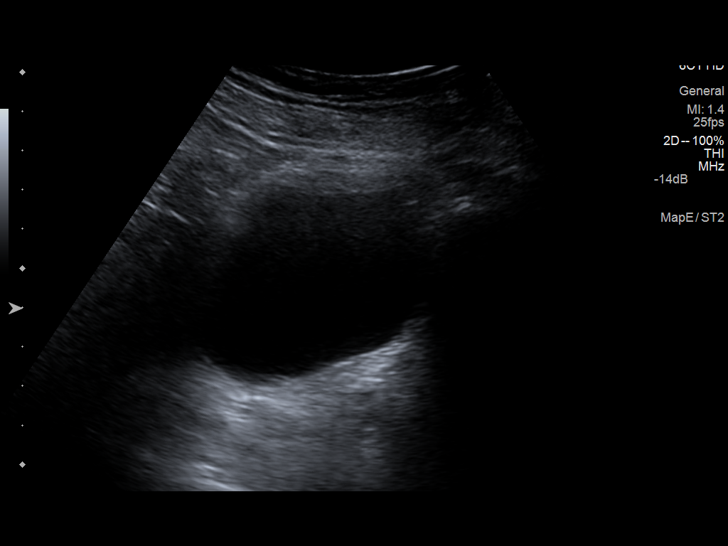

[14 of 25 positions shown; findings below may reference images not displayed]

FINDINGS: Right Kidney:

Length: 9.7 cm. Renal cortical thinning and prominent renal sinus
fat. The renal cortex is also slightly echogenic. No focal lesions
or hydronephrosis.

Left Kidney:

Length: 9.6 cm. Renal cortical thinning and increased echogenicity
but no hydronephrosis or renal mass.

Bladder:

Normal.
IMPRESSION: Age related renal cortical thinning.  No mass or hydronephrosis.
